# Patient Record
Sex: Male | Born: 1955 | Race: White | Hispanic: No | Marital: Married
Health system: Southern US, Community
[De-identification: ages and names within clinical notes are randomized; demographics above are authoritative.]

## PROBLEM LIST (undated history)

## (undated) DIAGNOSIS — Z87442 Personal history of urinary calculi: Secondary | ICD-10-CM

## (undated) DIAGNOSIS — C220 Liver cell carcinoma: Secondary | ICD-10-CM

## (undated) DIAGNOSIS — I10 Essential (primary) hypertension: Secondary | ICD-10-CM

## (undated) DIAGNOSIS — M549 Dorsalgia, unspecified: Secondary | ICD-10-CM

## (undated) DIAGNOSIS — D649 Anemia, unspecified: Secondary | ICD-10-CM

## (undated) DIAGNOSIS — K759 Inflammatory liver disease, unspecified: Secondary | ICD-10-CM

## (undated) HISTORY — PX: LIVER TRANSPLANT: SHX410

---

## 2017-01-02 DIAGNOSIS — A419 Sepsis, unspecified organism: Secondary | ICD-10-CM

## 2017-01-02 HISTORY — DX: Sepsis, unspecified organism: A41.9

## 2019-07-08 ENCOUNTER — Other Ambulatory Visit: Payer: Self-pay | Admitting: Infectious Diseases

## 2019-07-08 DIAGNOSIS — B182 Chronic viral hepatitis C: Secondary | ICD-10-CM

## 2019-07-08 DIAGNOSIS — D696 Thrombocytopenia, unspecified: Secondary | ICD-10-CM

## 2019-07-11 ENCOUNTER — Other Ambulatory Visit: Payer: Self-pay

## 2019-07-11 ENCOUNTER — Ambulatory Visit
Admission: RE | Admit: 2019-07-11 | Discharge: 2019-07-11 | Disposition: A | Discharge status: Home / Self Care | Payer: Medicare Other | Source: Ambulatory Visit | Attending: Infectious Diseases | Admitting: Infectious Diseases

## 2019-07-11 DIAGNOSIS — D696 Thrombocytopenia, unspecified: Secondary | ICD-10-CM | POA: Insufficient documentation

## 2019-07-11 DIAGNOSIS — B182 Chronic viral hepatitis C: Secondary | ICD-10-CM | POA: Diagnosis present

## 2019-07-11 IMAGING — US US ABDOMEN LIMITED
1 series · 14 of 25 positions shown · non-contrast
Comparison: None.

CLINICAL DATA: Hep C chronic

EXAM:
ULTRASOUND ABDOMEN LIMITED RIGHT UPPER QUADRANT

[Series 1: us abdomen limited ruq · 14 of 58 slices shown]
[im 1/58]
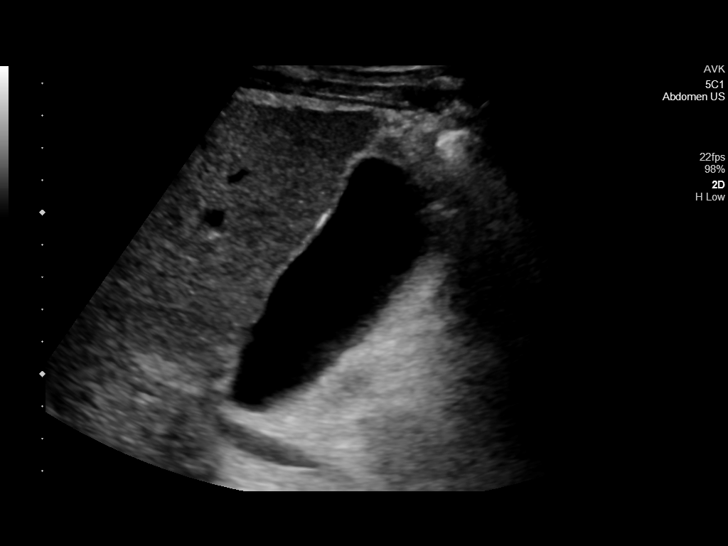
[im 5/58]
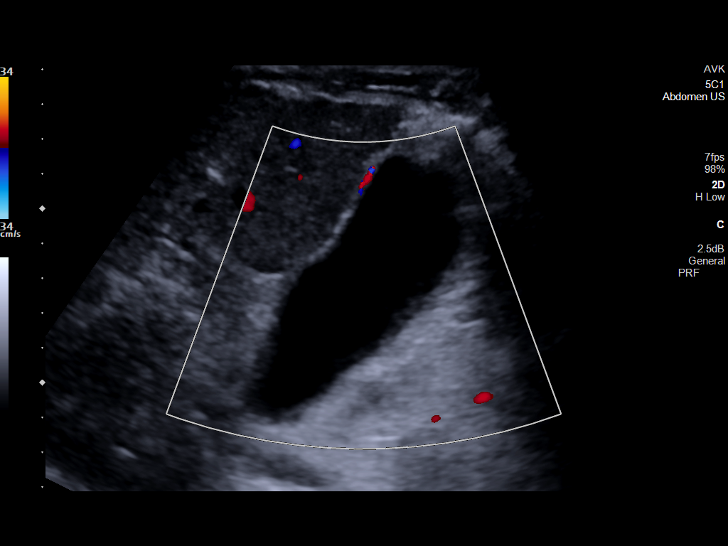
[im 10/58]
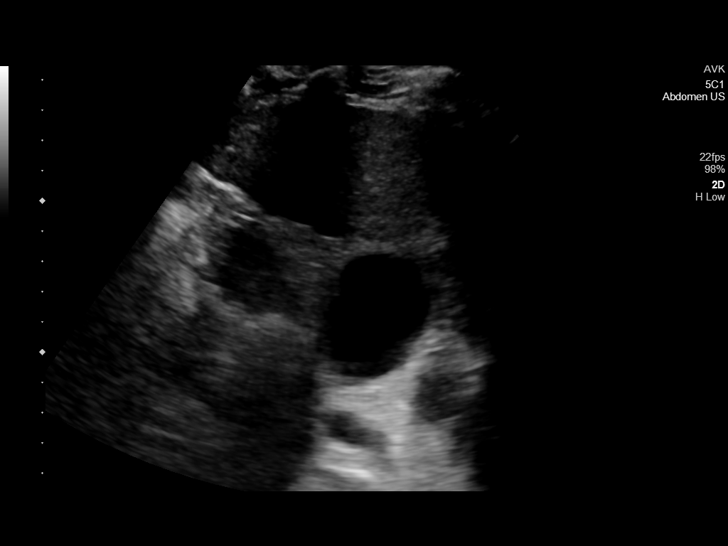
[im 15/58]
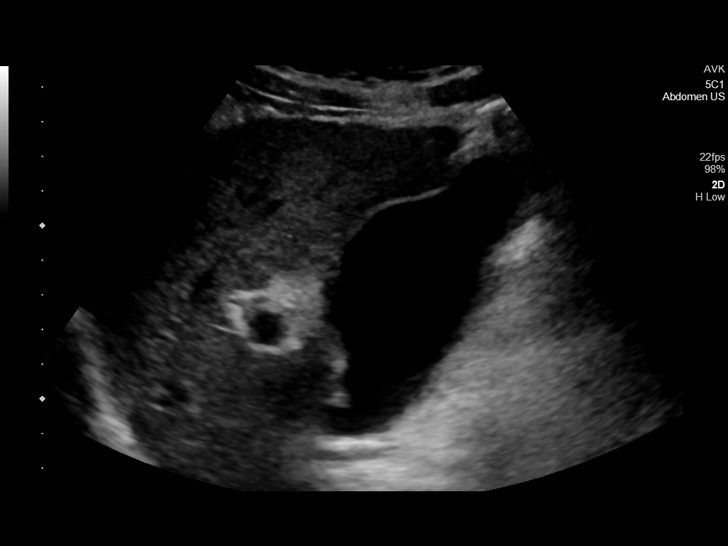
[im 20/58]
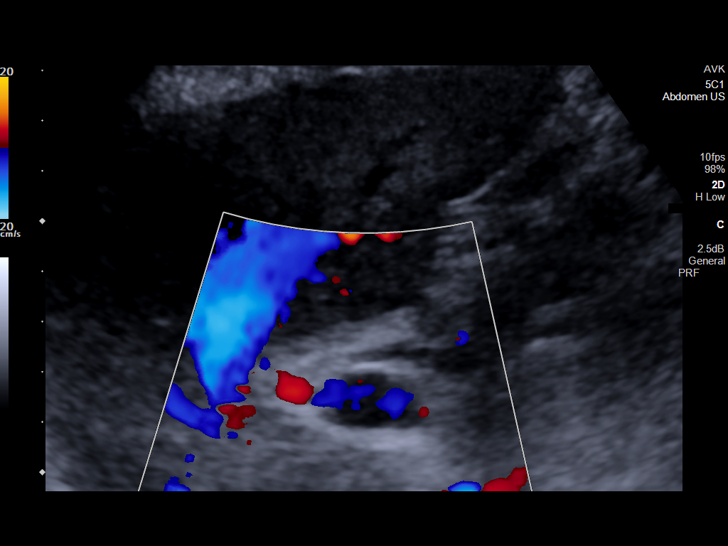
[im 22/58]
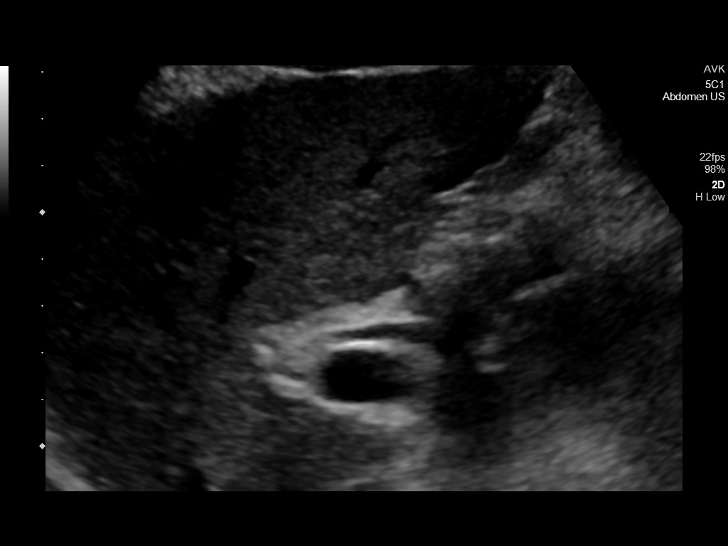
[im 27/58]
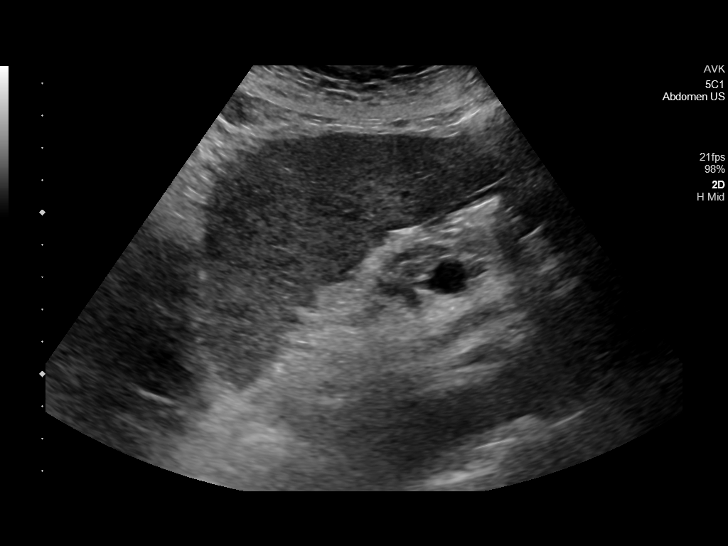
[im 31/58]
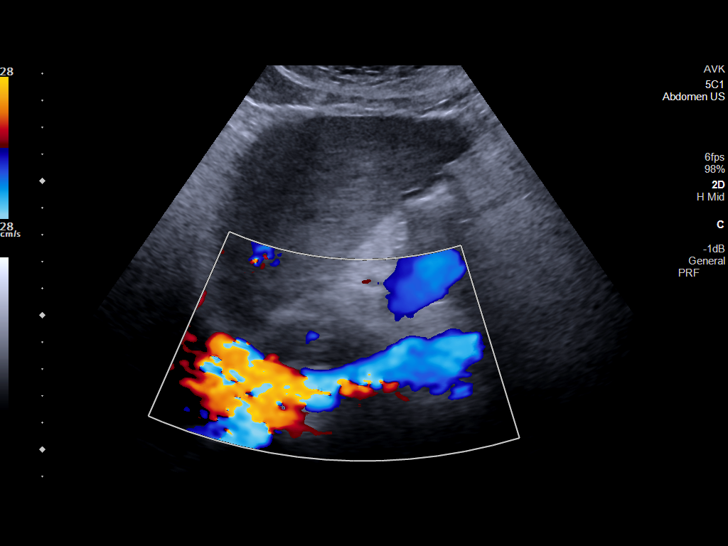
[im 36/58]
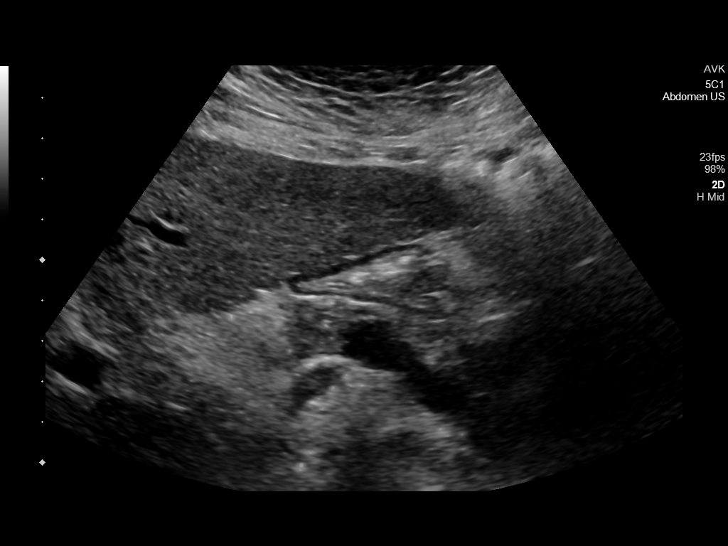
[im 39/58]
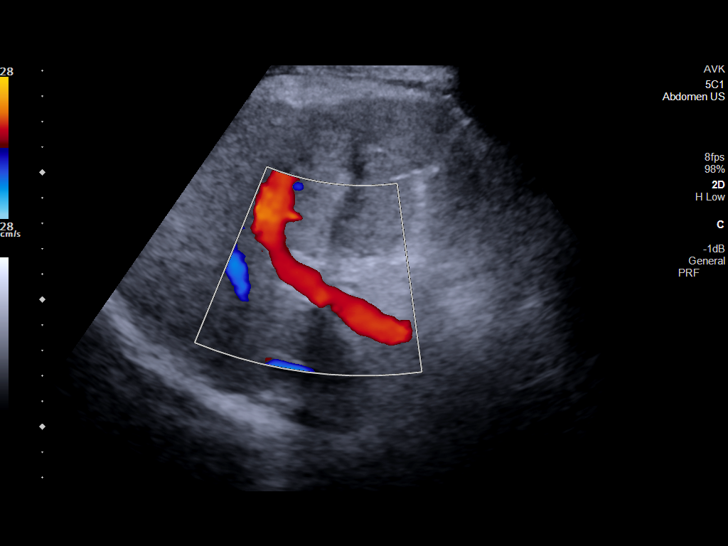
[im 43/58]
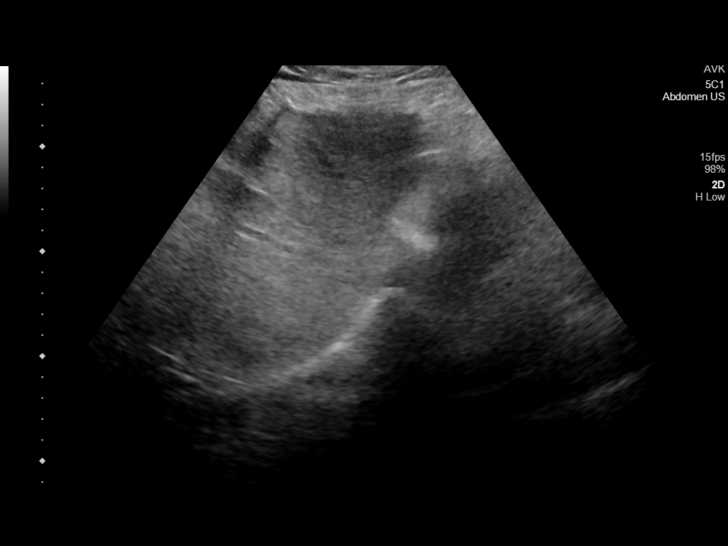
[im 48/58]
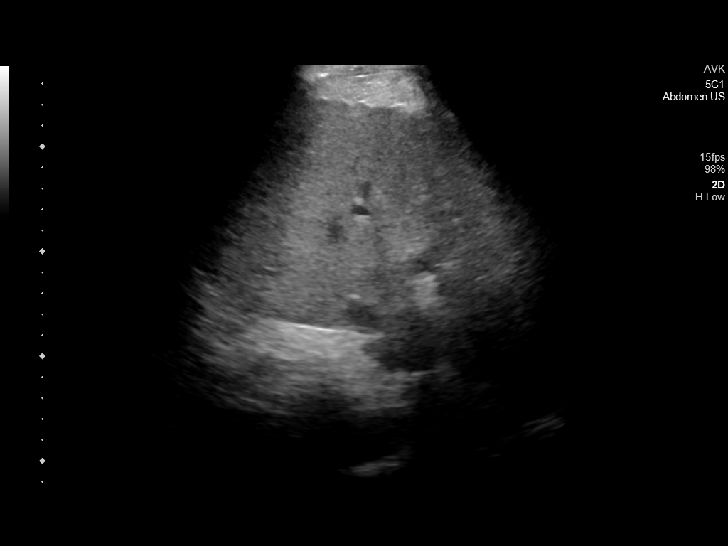
[im 53/58]
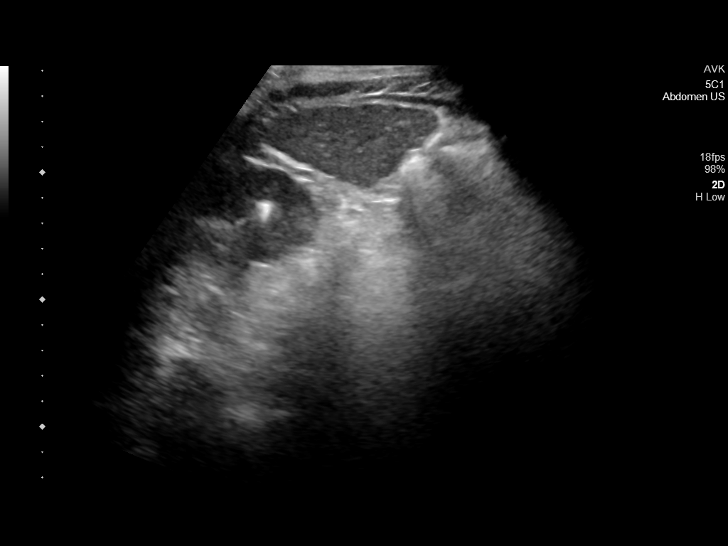
[im 58/58]
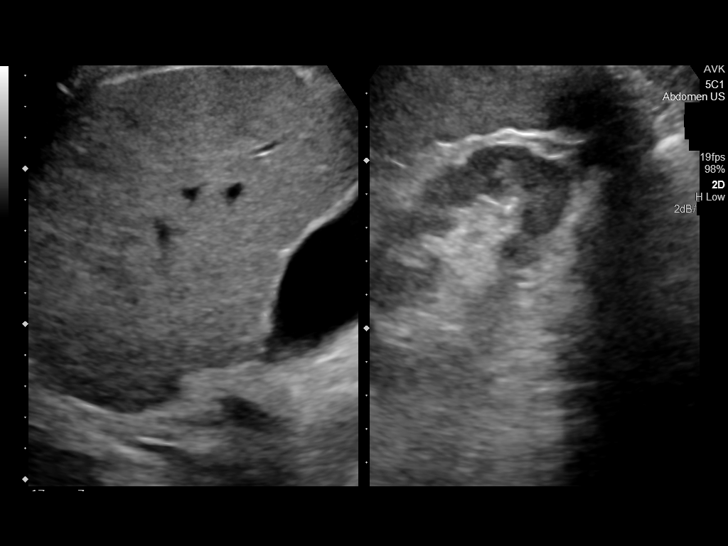

[14 of 25 positions shown; findings below may reference images not displayed]

FINDINGS: Gallbladder:

No gallstones or wall thickening visualized. No sonographic Murphy
sign noted by sonographer.

Common bile duct:

Diameter: 3.9 mm

Liver:

Liver is echogenic. Mild surface nodularity suspicious for
cirrhosis. No focal hepatic abnormality portal vein is patent on
color Doppler imaging with normal direction of blood flow towards
the liver.

Other: None.
IMPRESSION: Echogenic liver with nodular contour suspect for cirrhosis. No focal
hepatic mass lesion by sonography.

## 2019-09-10 ENCOUNTER — Other Ambulatory Visit: Payer: Self-pay | Admitting: Student

## 2019-09-10 DIAGNOSIS — B182 Chronic viral hepatitis C: Secondary | ICD-10-CM

## 2019-09-30 ENCOUNTER — Ambulatory Visit: Payer: Medicare Other

## 2019-10-07 ENCOUNTER — Ambulatory Visit
Admission: RE | Admit: 2019-10-07 | Discharge: 2019-10-07 | Disposition: A | Discharge status: Home / Self Care | Payer: Medicare Other | Source: Ambulatory Visit | Attending: Student | Admitting: Student

## 2019-10-07 ENCOUNTER — Other Ambulatory Visit: Payer: Self-pay

## 2019-10-07 DIAGNOSIS — B182 Chronic viral hepatitis C: Secondary | ICD-10-CM | POA: Diagnosis present

## 2019-10-07 DIAGNOSIS — K7460 Unspecified cirrhosis of liver: Secondary | ICD-10-CM | POA: Insufficient documentation

## 2019-10-07 IMAGING — MR MR ABDOMEN WO/W CM
16 of 17 series · 44 of 48 positions shown · IV contrast (8ml Gadavist)
Comparison: [DATE] abdominal ultrasound.

CLINICAL DATA: Chronic hepatitis C. Abnormal abdominal ultrasound
suspicious for cirrhosis.

EXAM:
MRI ABDOMEN WITHOUT AND WITH CONTRAST
TECHNIQUE: Multiplanar multisequence MR imaging of the abdomen was performed
both before and after the administration of intravenous contrast.
CONTRAST:  8mL GADAVIST GADOBUTROL 1 MMOL/ML IV SOLN

[Series 4: T2 · coronal · 6.0mm · 1.19mm/px · 2 of 34 slices shown (1 of 2)]
[im 1/34]
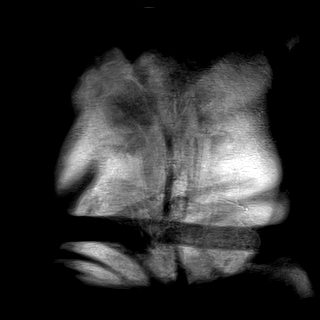
[im 34/34]
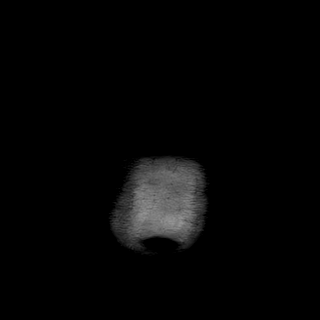

[Series 5: T2 · axial · 6.0mm · 1.19mm/px · z∈[-113,+125]mm · 2 of 34 slices shown (2 of 2)]
[im 1/34]
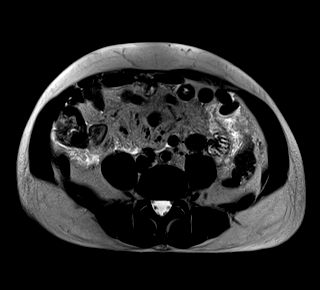
[im 34/34]
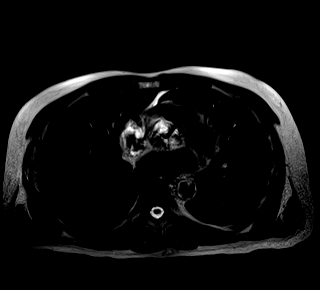

[Series 8: ax dwi_tracew · axial · 6.0mm · 1.42mm/px · z∈[-113,+125]mm · 5 of 102 slices shown]
[im 1/102]
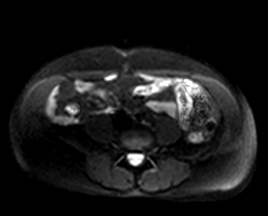
[im 26/102]
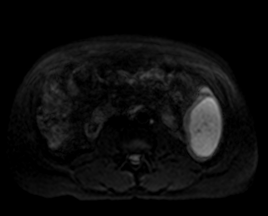
[im 51/102]
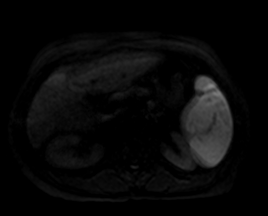
[im 76/102]
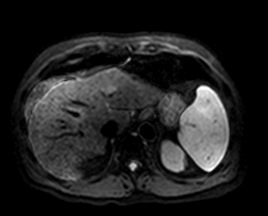
[im 102/102]
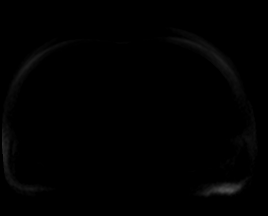

[Series 9: ax dwi_adc · axial · 6.0mm · 1.42mm/px · z∈[-113,+125]mm · 2 of 34 slices shown]
[im 1/34]
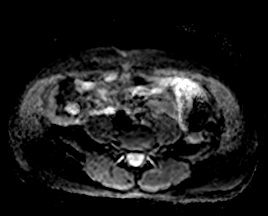
[im 34/34]
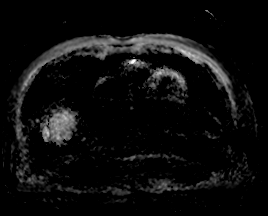

[Series 10: T1 · axial · 6.0mm · 0.74mm/px · z∈[-113,+125]mm · 4 of 68 slices shown]
[im 1/68]
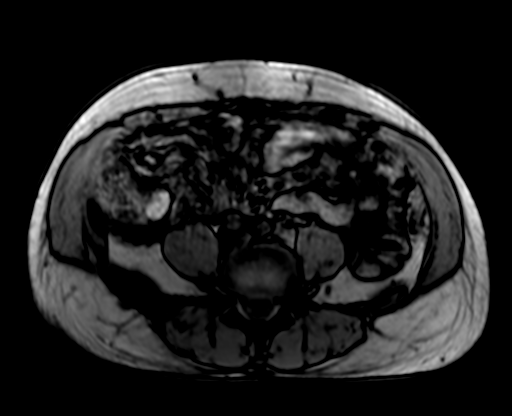
[im 23/68]
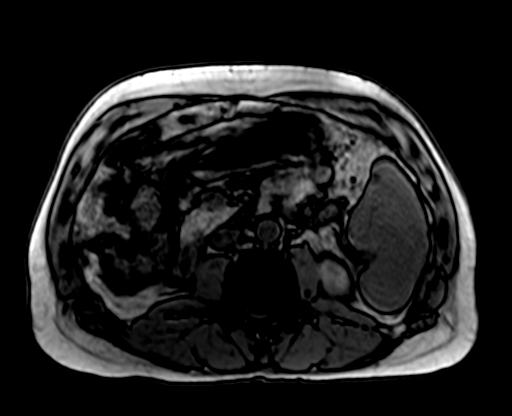
[im 45/68]
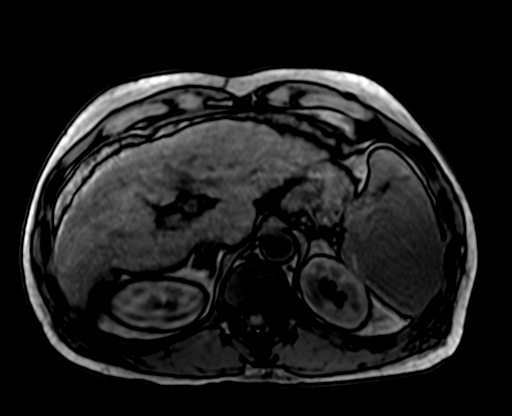
[im 68/68]
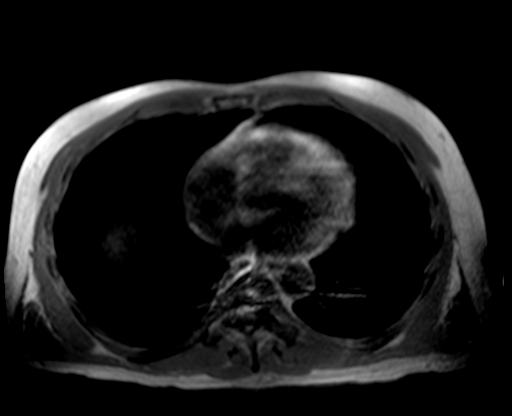

[Series 11: bSSFP · axial · 6.0mm · 0.74mm/px · z∈[-113,+125]mm · 2 of 34 slices shown]
[im 1/34]
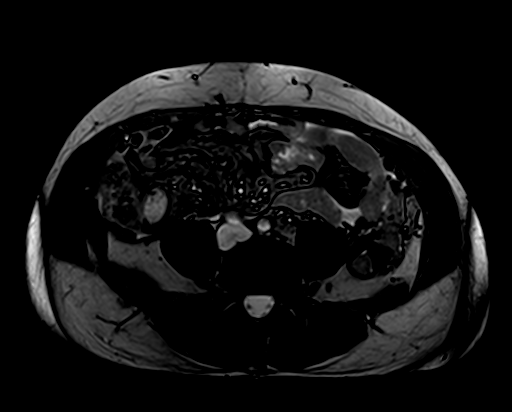
[im 34/34]
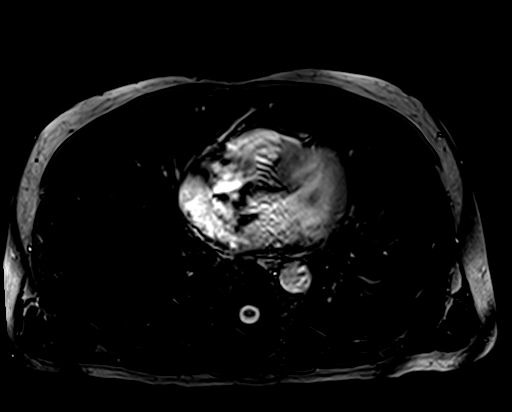

[Series 13: T2 fat-sat · axial · 6.0mm · 1.19mm/px · 1 of 34 slices shown]
[im 1/34]
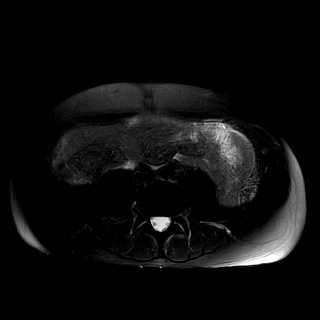

[Series 14: T1 dynamic fat-sat · axial · non-contrast · 3.0mm · 1.19mm/px · z∈[-112,+125]mm · 3 of 80 slices shown (1 of 4)]
[im 1/80]
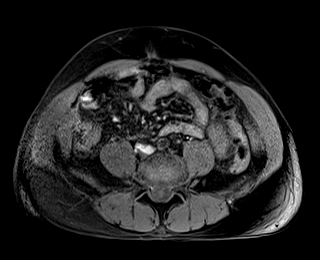
[im 40/80]
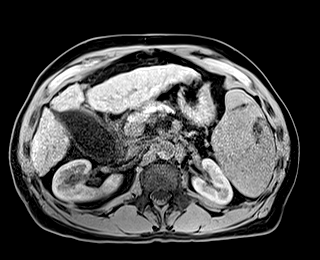
[im 80/80]
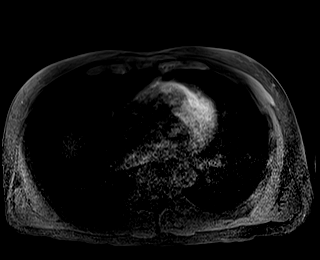

[Series 15: T1 dynamic fat-sat post-contrast · axial · 3.0mm · 1.19mm/px · z∈[-112,+125]mm · 3 of 80 slices shown (1 of 4)]
[im 1/80]
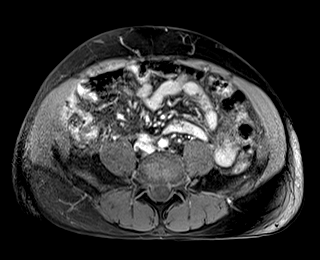
[im 40/80]
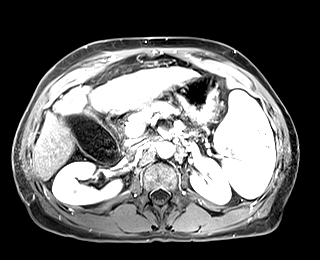
[im 80/80]
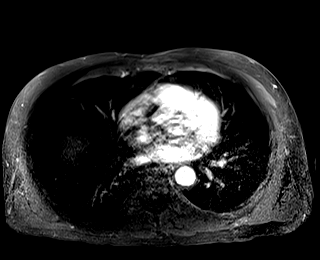

[Series 16: T1 dynamic fat-sat · axial · 3.0mm · 1.19mm/px · z∈[-112,+125]mm · 3 of 80 slices shown (2 of 4)]
[im 1/80]
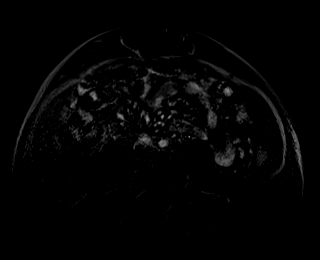
[im 40/80]
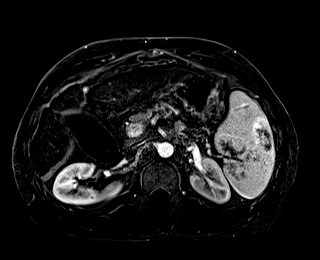
[im 80/80]
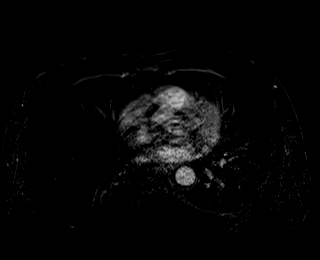

[Series 17: T1 dynamic fat-sat post-contrast · axial · 3.0mm · 1.19mm/px · z∈[-112,+125]mm · 3 of 80 slices shown (2 of 4)]
[im 1/80]
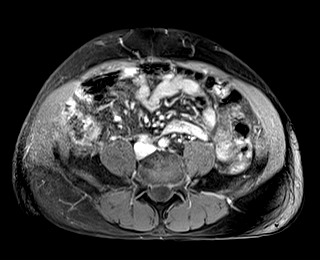
[im 40/80]
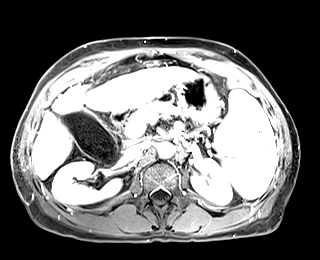
[im 80/80]
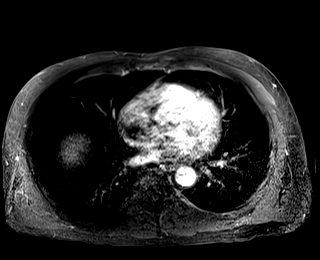

[Series 18: T1 dynamic fat-sat · axial · 3.0mm · 1.19mm/px · z∈[-112,+125]mm · 3 of 80 slices shown (3 of 4)]
[im 1/80]
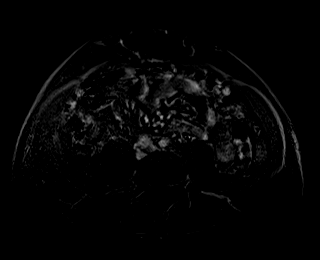
[im 40/80]
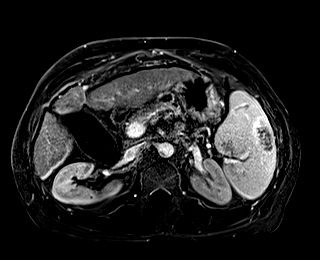
[im 80/80]
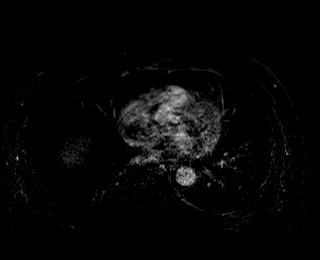

[Series 19: T1 dynamic fat-sat post-contrast · axial · 3.0mm · 1.19mm/px · z∈[-112,+125]mm · 3 of 80 slices shown (3 of 4)]
[im 1/80]
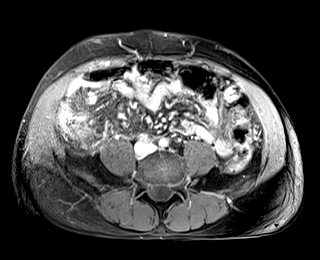
[im 40/80]
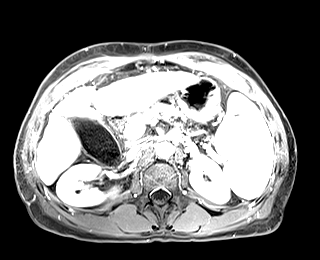
[im 80/80]
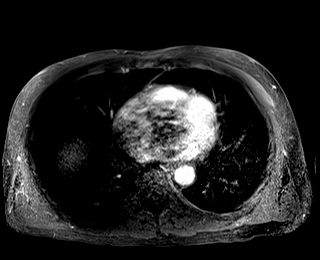

[Series 20: T1 dynamic fat-sat · axial · 3.0mm · 1.19mm/px · z∈[-112,+125]mm · 3 of 80 slices shown (4 of 4)]
[im 1/80]
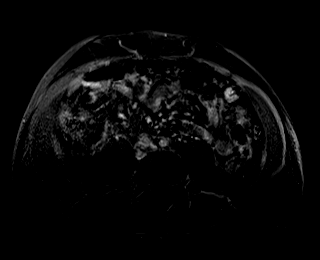
[im 40/80]
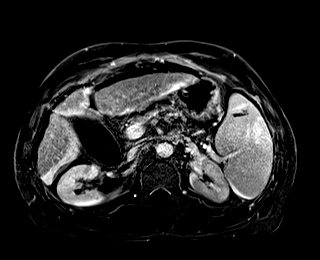
[im 80/80]
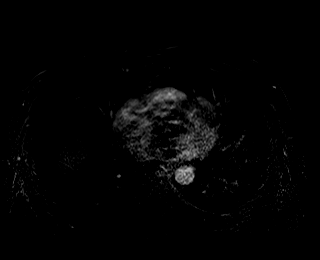

[Series 21: T1 dynamic post-contrast · coronal · 3.0mm · 1.31mm/px · 3 of 80 slices shown]
[im 1/80]
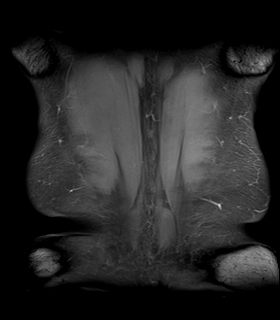
[im 40/80]
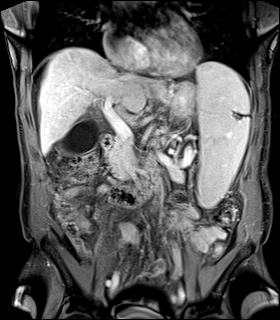
[im 80/80]
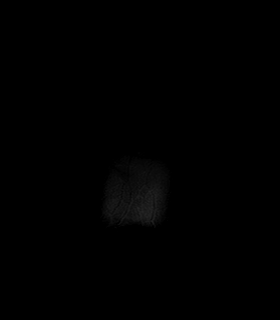

[Series 22: T1 dynamic fat-sat post-contrast · axial · 3.0mm · 1.19mm/px · z∈[-112,+5]mm · 2 of 80 slices shown (4 of 4)]
[im 1/80]
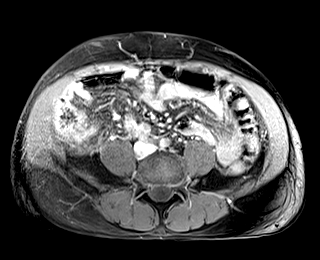
[im 40/80]
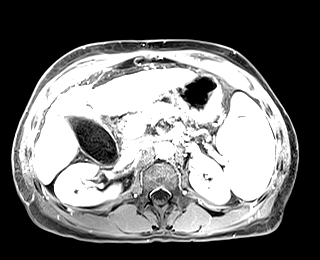

[44 of 48 positions shown; findings below may reference images not displayed]

FINDINGS: Lower chest: Normal heart size without pericardial or pleural
effusion. Suspect small periesophageal hip varices.

Hepatobiliary: Moderate cirrhosis, as evidenced by irregular hepatic
capsule, caudate lobe enlargement and medial segment left liver lobe
atrophy.

Multiple hepatic observations of varying suspicion.

-1. Lateral hepatic dome 7 mm focus of arterial hyperenhancement on
[DATE], without a correlate on other pulse sequences. LR 3

-2. More medial hepatic dome (likely segment 8) 1.0 cm focus of
arterial hyperenhancement on [DATE] corresponds to portal venous
phase hypoenhancement including on [DATE]. LR 5

-3. Segment 8 1.0 cm focus of arterial hyperenhancement on 18/15
corresponds to portal venous phase hypoenhancement on 18/19. LR 5

-4. Segment 2 1.6 cm focus of arterial hyperenhancement on 23/15 is
without correlate on other pulse sequences. LR 3

-5. Inferior right hepatic lobe 1.0 cm arterial focus of
hyperenhancement on 51/15 is without correlate on other pulse
sequences. LR 3

-6. Portal venous phase hypoenhancement at the base of the caudate
lobe measures 9 mm on 25/19 and is without correlate on arterial
phase imaging. LR 3

Mild gallbladder distension and wall thickening, nonspecific in the
setting of cirrhosis. No stones or pericholecystic hyperemia.

Pancreas:  Normal, without mass or ductal dilatation.

Spleen: Splenomegaly, including at 18.2 cm craniocaudal. A lateral
splenic hypoenhancing lesion measures 4.0 x 2.1 cm on 33/20. This is
mildly T1-T2 hypointense prior to contrast. This is relatively
linear and wedge-shaped on coronal series 21, favoring infarct.
Other areas of linear hypoenhancement within the more anterior
spleen may also represent remote infarcts.

Adrenals/Urinary Tract: Normal adrenal glands. Normal kidneys,
without hydronephrosis.

Stomach/Bowel: Gastric cardia varices. Normal abdominal bowel loops.

Vascular/Lymphatic: Aortic atherosclerosis. Patent portal, hepatic,
splenic veins. Portal venous hypertension, including a recannulized
paraumbilical vein.

Other:  Small volume abdominal ascites.

Musculoskeletal: No acute osseous abnormality.
IMPRESSION: 1. Multiple liver lesions as detailed above. 2 lesions, within the
hepatic dome, are consistent with hepatocellular carcinoma (LR 5).
2. Cirrhosis with portal venous hypertension, splenomegaly, and
ascites.
3. Nonspecific gallbladder wall thickening and distension in the
setting of portal venous hypertension.
4. Splenic infarcts.

These results will be called to the ordering clinician or
representative by the Radiologist Assistant, and communication
documented in the PACS or [REDACTED].

## 2019-10-07 MED ORDER — GADOBUTROL 1 MMOL/ML IV SOLN
8.0000 mL | Freq: Once | INTRAVENOUS | Status: AC | PRN
  Administered 2019-10-07: 8 mL via INTRAVENOUS

## 2019-10-13 ENCOUNTER — Ambulatory Visit (INDEPENDENT_AMBULATORY_CARE_PROVIDER_SITE_OTHER): Payer: Medicare Other

## 2019-10-13 ENCOUNTER — Ambulatory Visit (INDEPENDENT_AMBULATORY_CARE_PROVIDER_SITE_OTHER): Payer: Medicare Other | Admitting: Dermatology

## 2019-10-13 ENCOUNTER — Other Ambulatory Visit: Payer: Self-pay

## 2019-10-13 DIAGNOSIS — C229 Malignant neoplasm of liver, not specified as primary or secondary: Secondary | ICD-10-CM

## 2019-10-13 DIAGNOSIS — L409 Psoriasis, unspecified: Secondary | ICD-10-CM

## 2019-10-13 DIAGNOSIS — L400 Psoriasis vulgaris: Secondary | ICD-10-CM | POA: Diagnosis not present

## 2019-10-13 MED ORDER — MOMETASONE FUROATE 0.1 % EX SOLN: Freq: Every day | CUTANEOUS | 2 refills | Status: DC

## 2019-10-13 MED ORDER — TRIAMCINOLONE ACETONIDE 0.1 % EX OINT: TOPICAL_OINTMENT | CUTANEOUS | 2 refills | Status: DC

## 2019-10-13 NOTE — Progress Notes (Signed)
Patient treated in the lightbox/NBUVB for 1 minutes and 44 seconds.

## 2019-10-13 NOTE — Progress Notes (Signed)
   New Patient Visit  Subjective  Jaime Powell is a 64 y.o. male who presents for the following: Psoriasis (Patient here today for psoriasis all over. He has been using clobetasol 0.05% ointment. ). It does itch, clobetasol 0.05% not helping.  Patient was in [facility] March and April 2021 for 1 1/2 months.  Patient diagnosed last week with liver cancer.   The following portions of the chart were reviewed this encounter and updated as appropriate:  Allergies  Meds  Problems  Med Hx  Surg Hx  Fam Hx     Review of Systems:  No other skin or systemic complaints except as noted in HPI or Assessment and Plan.  Objective  Well appearing patient in no apparent distress; mood and affect are within normal limits.  A focused examination was performed including face, neck, chest and back and scalp, legs, arms. Relevant physical exam findings are noted in the Assessment and Plan.  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 Trunk, extremities, scalp Will plan to start NVUVB treatments twice weekly. 1st treatment today.  Start TMC 0.1% ointment to affected areas one to two times daily as needed for psoriasis. Avoid applying to face, groin, and axilla. Use as directed. Risk of skin atrophy with long-term use reviewed.   Start mometasone solution every other day to scalp and leave in overnight. Avoid applying to face, groin, and axilla. Use as directed. Risk of skin atrophy with long-term use reviewed.   Recommend Tar shampoo.  Topical steroids (such as triamcinolone, fluocinolone, fluocinonide, mometasone, clobetasol, halobetasol, betamethasone, hydrocortisone) can cause thinning and lightening of the skin if they are used  for too long in the same area. Your physician has selected the right strength medicine for your problem and area affected on the body. Please use your medication only as directed by your physician to prevent side effects.   Ordered Medications: mometasone (ELOCON) 0.1 % lotion triamcinolone ointment (KENALOG) 0.1 %  Return in about 6 weeks (around 11/24/2019).  Graciella Belton, RMA, am acting as scribe for Sarina Ser, MD . Documentation: I have reviewed the above documentation for accuracy and completeness, and I agree with the above.  Sarina Ser, MD

## 2019-10-13 NOTE — Patient Instructions (Signed)
Topical steroids (such as triamcinolone, fluocinolone, fluocinonide, mometasone, clobetasol, halobetasol, betamethasone, hydrocortisone) can cause thinning and lightening of the skin if they are used for too long in the same area. Your physician has selected the right strength medicine for your problem and area affected on the body. Please use your medication only as directed by your physician to prevent side effects.   . 

## 2019-10-14 ENCOUNTER — Encounter: Payer: Self-pay | Admitting: Dermatology

## 2019-10-15 ENCOUNTER — Ambulatory Visit: Payer: Medicare Other

## 2019-10-29 ENCOUNTER — Ambulatory Visit: Payer: Medicare Other | Admitting: Dermatology

## 2019-12-17 ENCOUNTER — Other Ambulatory Visit: Payer: Self-pay | Admitting: Internal Medicine

## 2019-12-17 DIAGNOSIS — B1920 Unspecified viral hepatitis C without hepatic coma: Secondary | ICD-10-CM

## 2019-12-17 DIAGNOSIS — R1011 Right upper quadrant pain: Secondary | ICD-10-CM

## 2019-12-19 ENCOUNTER — Other Ambulatory Visit: Payer: Self-pay

## 2019-12-19 ENCOUNTER — Ambulatory Visit
Admission: RE | Admit: 2019-12-19 | Discharge: 2019-12-19 | Disposition: A | Discharge status: Home / Self Care | Payer: Medicare Other | Source: Ambulatory Visit | Attending: Internal Medicine | Admitting: Internal Medicine

## 2019-12-19 DIAGNOSIS — B1920 Unspecified viral hepatitis C without hepatic coma: Secondary | ICD-10-CM | POA: Diagnosis present

## 2019-12-19 DIAGNOSIS — R1011 Right upper quadrant pain: Secondary | ICD-10-CM | POA: Diagnosis present

## 2019-12-19 IMAGING — US US ABDOMEN LIMITED
1 series · 15 of 25 positions shown · non-contrast
Comparison: Abdominal MRI [DATE] and ultrasound [DATE]

CLINICAL DATA: Right upper quadrant pain. Hepatitis C. History of
HCC status post recent radioembolization.

EXAM:
ULTRASOUND ABDOMEN LIMITED RIGHT UPPER QUADRANT

[Series 1: us abdomen limited ruq · 15 of 43 slices shown]
[im 1/43]
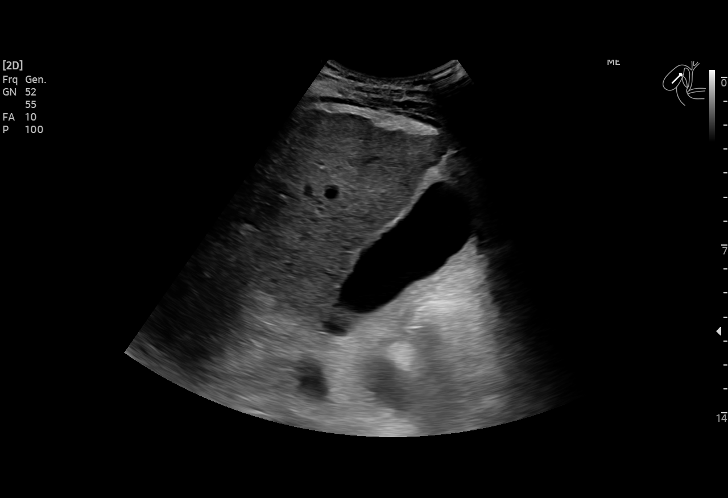
[im 4/43]
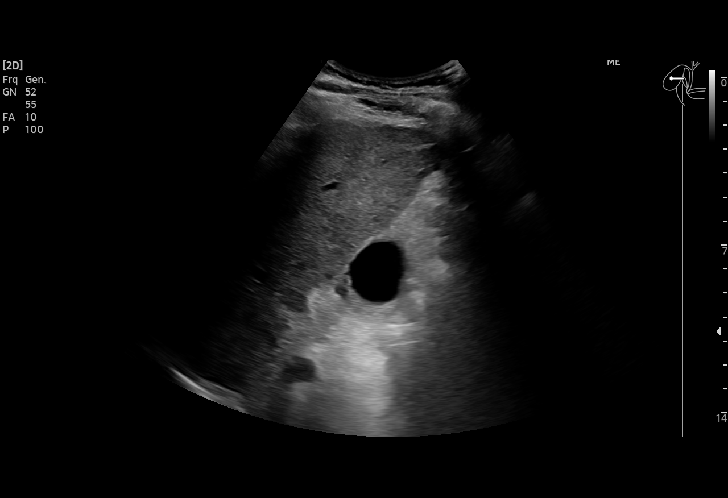
[im 8/43]
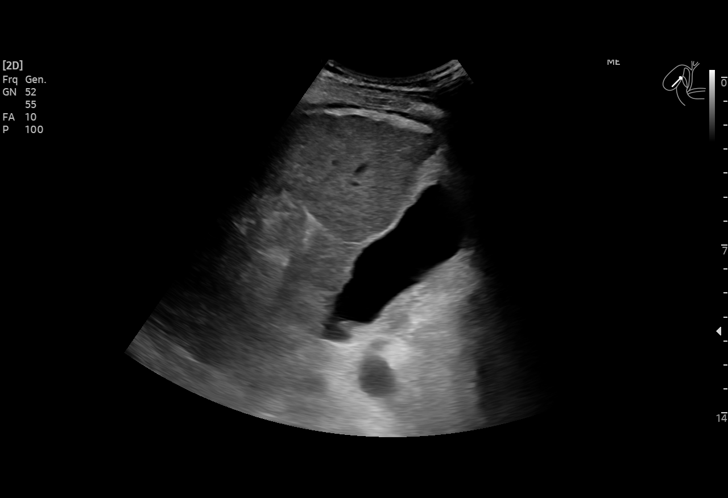
[im 9/43]
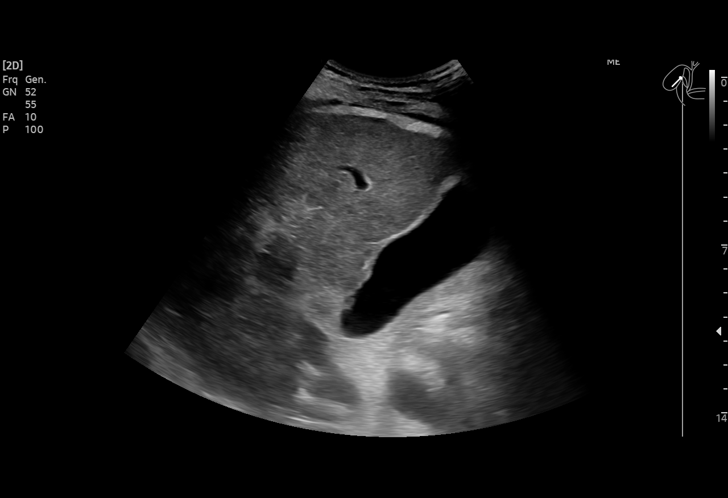
[im 13/43]
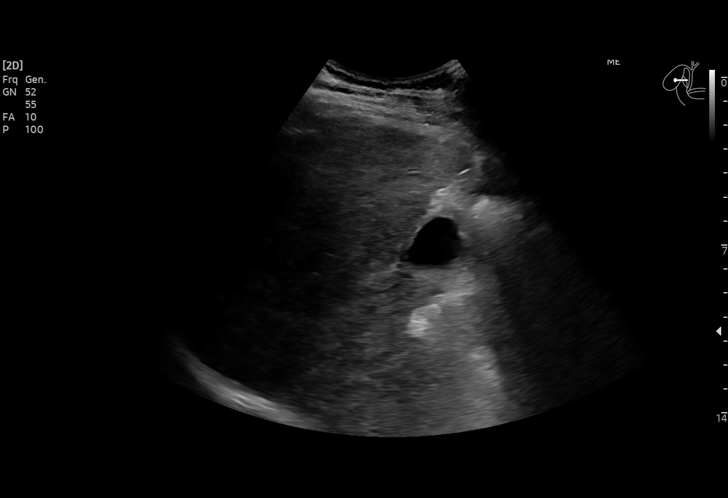
[im 16/43]
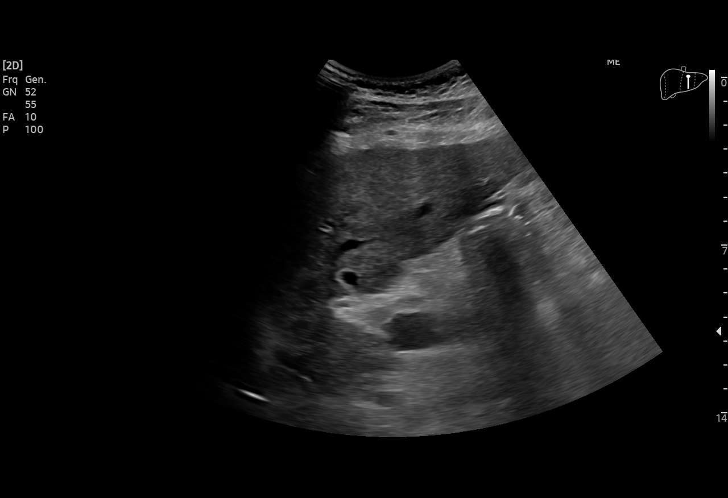
[im 18/43]
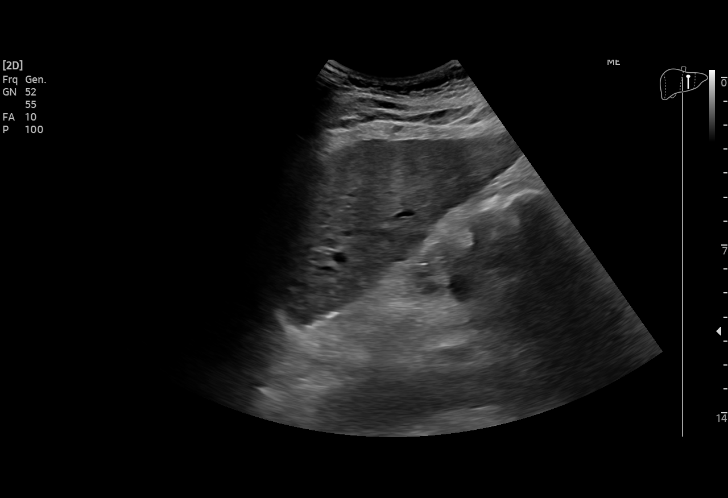
[im 22/43]
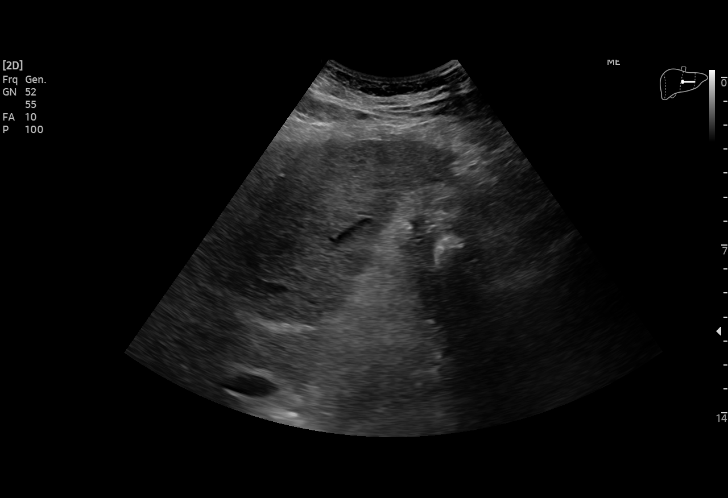
[im 25/43]
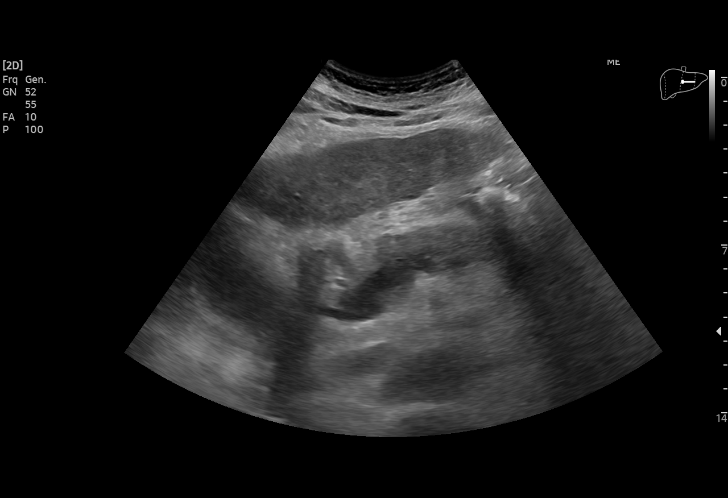
[im 27/43]
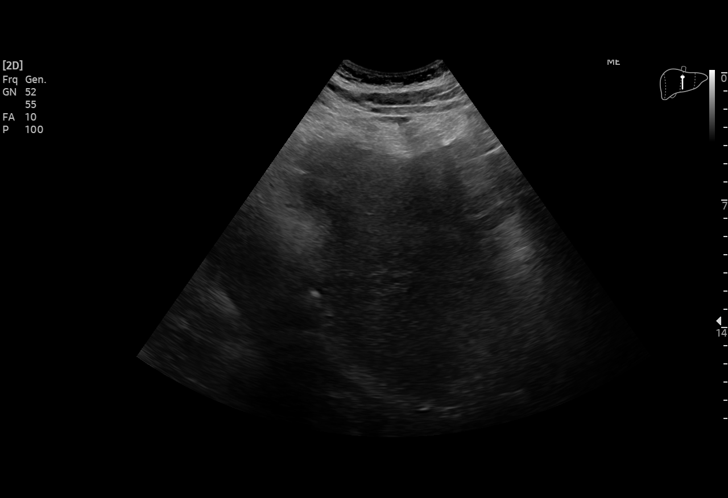
[im 30/43]
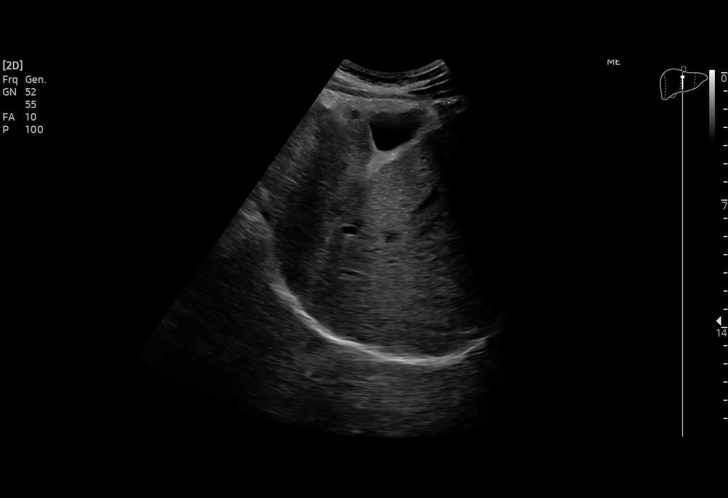
[im 34/43]
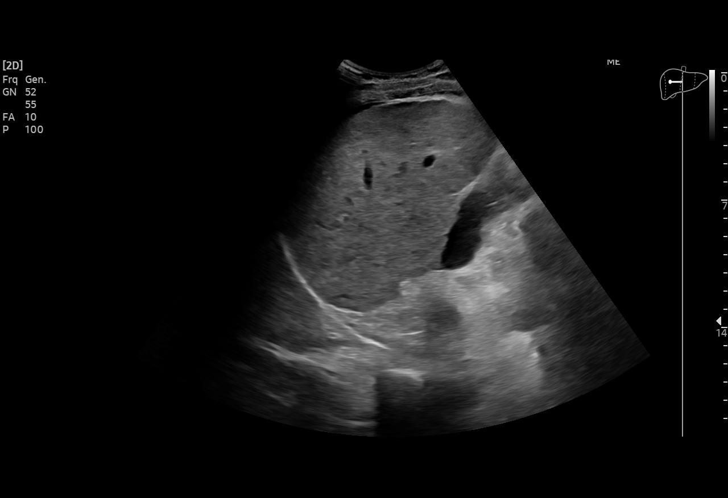
[im 36/43]
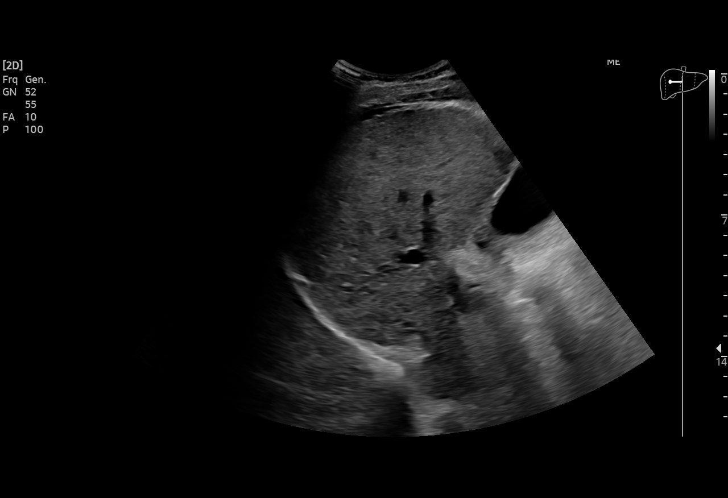
[im 39/43]
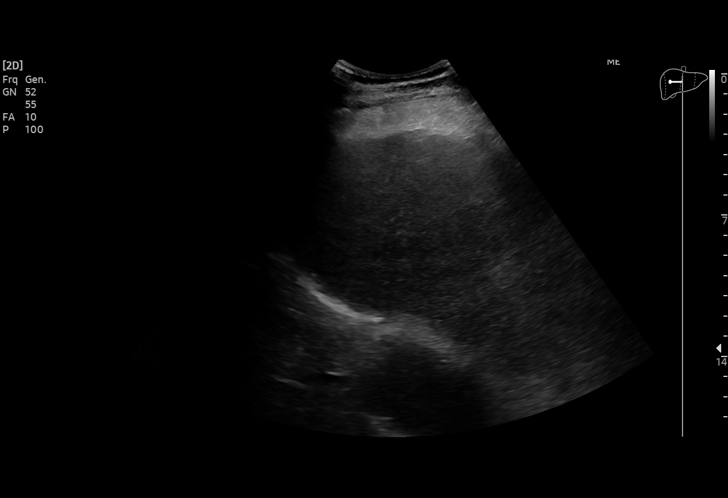
[im 43/43]
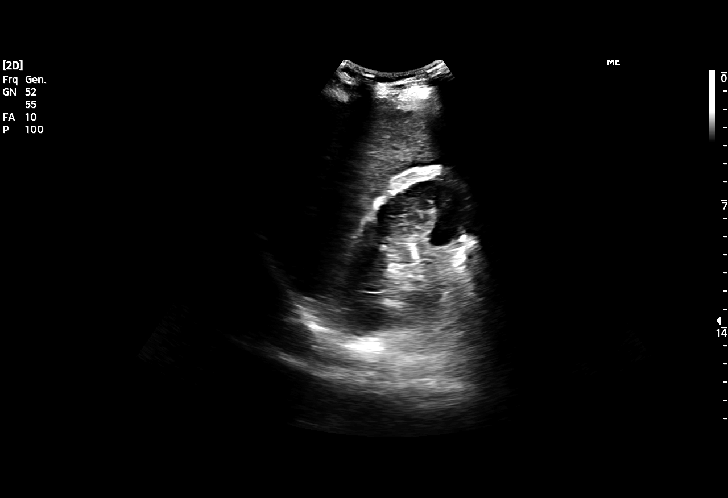

[15 of 25 positions shown; findings below may reference images not displayed]

FINDINGS: Gallbladder:

No gallstones. Mild gallbladder wall thickening, also present on the
prior MRI and likely secondary to chronic liver disease. No
sonographic Murphy sign noted by sonographer.

Common bile duct:

Diameter: 4 mm

Liver:

Diffusely increased parenchymal echogenicity with nodular liver
contour. Multiple small liver lesions on MRI are not clearly visible
by ultrasound. Portal vein is patent on color Doppler imaging with
normal direction of blood flow towards the liver.

Other: None.
IMPRESSION: 1. Cirrhosis.  No visible liver mass or acute finding on ultrasound.
2. Mild chronic gallbladder wall thickening.

## 2020-01-03 DIAGNOSIS — Z944 Liver transplant status: Secondary | ICD-10-CM

## 2020-01-03 HISTORY — DX: Liver transplant status: Z94.4

## 2021-04-07 ENCOUNTER — Other Ambulatory Visit: Payer: Self-pay | Admitting: Sports Medicine

## 2021-04-07 ENCOUNTER — Other Ambulatory Visit (HOSPITAL_COMMUNITY): Payer: Self-pay | Admitting: Sports Medicine

## 2021-04-07 DIAGNOSIS — M778 Other enthesopathies, not elsewhere classified: Secondary | ICD-10-CM

## 2021-04-07 DIAGNOSIS — M7542 Impingement syndrome of left shoulder: Secondary | ICD-10-CM

## 2021-04-07 DIAGNOSIS — M67922 Unspecified disorder of synovium and tendon, left upper arm: Secondary | ICD-10-CM

## 2021-04-07 DIAGNOSIS — M7552 Bursitis of left shoulder: Secondary | ICD-10-CM

## 2021-04-07 DIAGNOSIS — G8929 Other chronic pain: Secondary | ICD-10-CM

## 2021-04-07 DIAGNOSIS — M7522 Bicipital tendinitis, left shoulder: Secondary | ICD-10-CM

## 2021-04-22 ENCOUNTER — Ambulatory Visit
Admission: RE | Admit: 2021-04-22 | Discharge: 2021-04-22 | Disposition: A | Discharge status: Home / Self Care | Payer: Medicare Other | Source: Ambulatory Visit | Attending: Sports Medicine | Admitting: Sports Medicine

## 2021-04-22 ENCOUNTER — Ambulatory Visit: Admission: RE | Admit: 2021-04-22 | Payer: Medicare Other | Source: Ambulatory Visit

## 2021-04-22 DIAGNOSIS — M7552 Bursitis of left shoulder: Secondary | ICD-10-CM | POA: Insufficient documentation

## 2021-04-22 DIAGNOSIS — G8929 Other chronic pain: Secondary | ICD-10-CM | POA: Insufficient documentation

## 2021-04-22 DIAGNOSIS — M778 Other enthesopathies, not elsewhere classified: Secondary | ICD-10-CM | POA: Insufficient documentation

## 2021-04-22 DIAGNOSIS — M25512 Pain in left shoulder: Secondary | ICD-10-CM | POA: Insufficient documentation

## 2021-04-22 DIAGNOSIS — M67922 Unspecified disorder of synovium and tendon, left upper arm: Secondary | ICD-10-CM | POA: Insufficient documentation

## 2021-04-22 DIAGNOSIS — M7542 Impingement syndrome of left shoulder: Secondary | ICD-10-CM | POA: Insufficient documentation

## 2021-04-22 DIAGNOSIS — M7522 Bicipital tendinitis, left shoulder: Secondary | ICD-10-CM | POA: Insufficient documentation

## 2021-04-22 IMAGING — MR MR SHOULDER*L* W/O CM
4 of 5 series · 30 of 40 positions shown · non-contrast
Comparison: None available

CLINICAL DATA: Left shoulder pain, progressively worse for 6
months. Liver transplant 6 months ago.

EXAM:
MRI OF THE LEFT SHOULDER WITHOUT CONTRAST
TECHNIQUE: Multiplanar, multisequence MR imaging of the shoulder was performed.
No intravenous contrast was administered.

[Series 5: T2 fat-sat · axial · left · 4.0mm · 0.44mm/px · z∈[-82,+36]mm · 8 of 26 slices shown (1 of 3)]
[im 1/26]
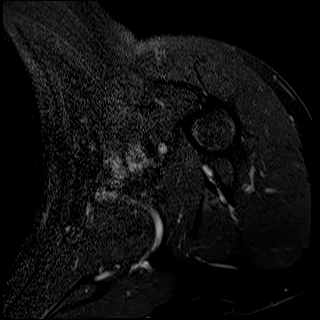
[im 4/26]
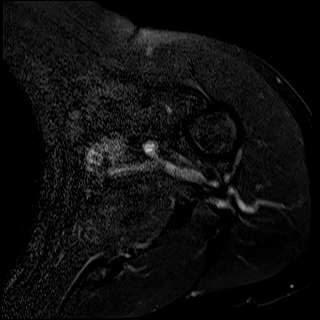
[im 8/26]
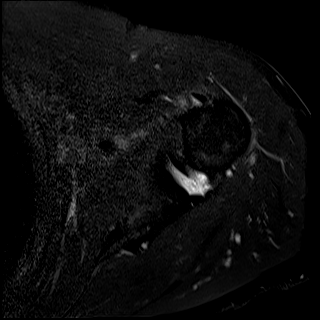
[im 11/26]
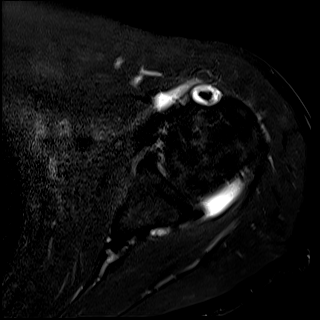
[im 15/26]
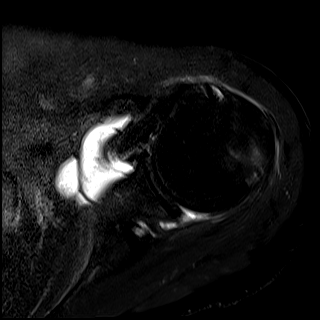
[im 18/26]
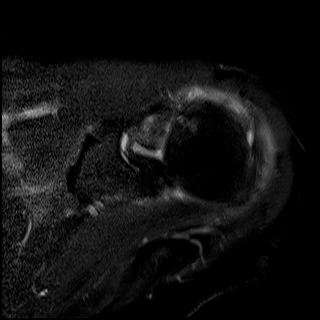
[im 22/26]
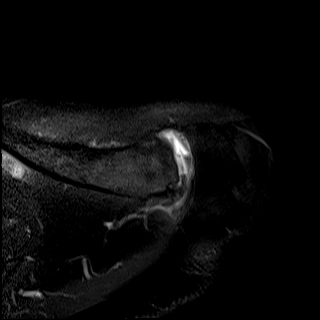
[im 26/26]
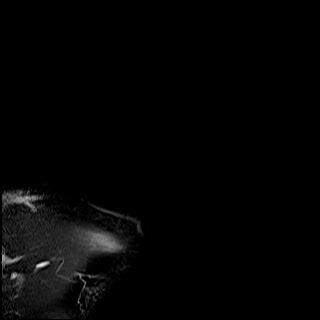

[Series 6: PD · oblique · left · 4.0mm · 0.44mm/px · 8 of 26 slices shown]
[im 1/26]
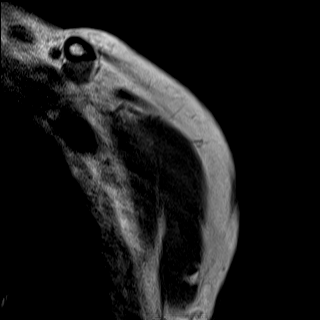
[im 4/26]
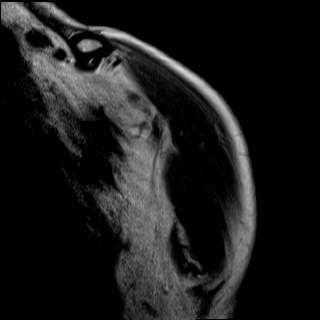
[im 8/26]
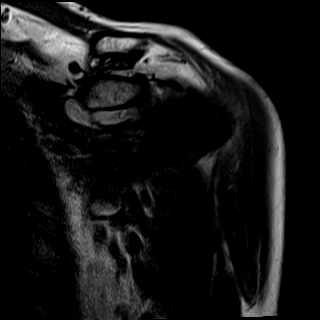
[im 11/26]
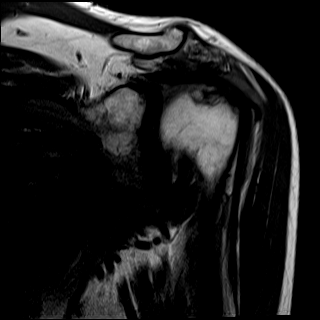
[im 15/26]
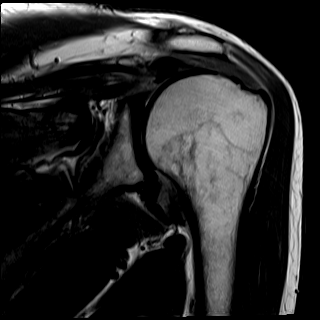
[im 18/26]
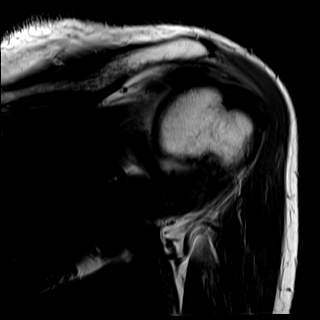
[im 22/26]
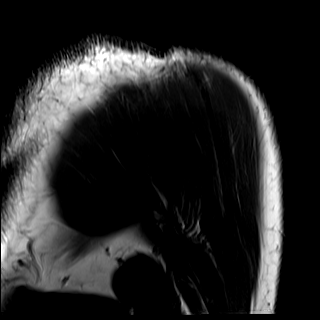
[im 26/26]
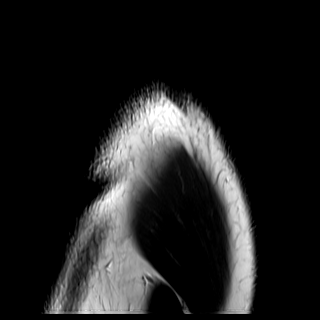

[Series 7: T2 fat-sat · oblique · left · 4.0mm · 0.44mm/px · 8 of 26 slices shown (2 of 3)]
[im 1/26]
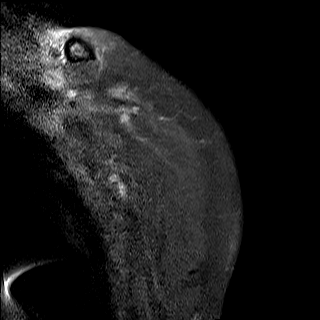
[im 4/26]
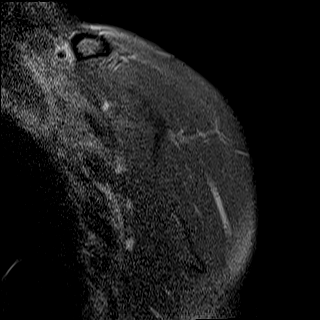
[im 8/26]
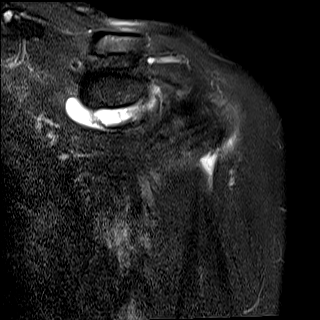
[im 11/26]
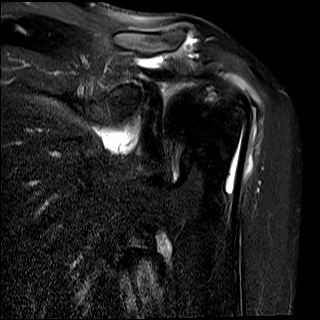
[im 15/26]
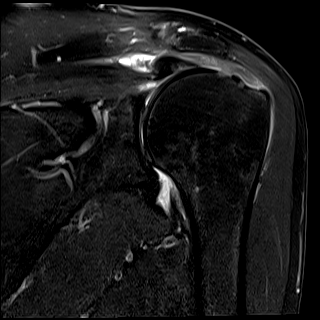
[im 18/26]
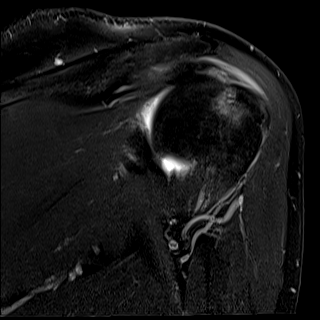
[im 22/26]
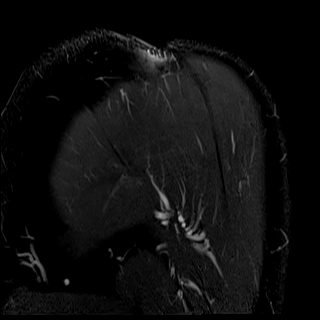
[im 26/26]
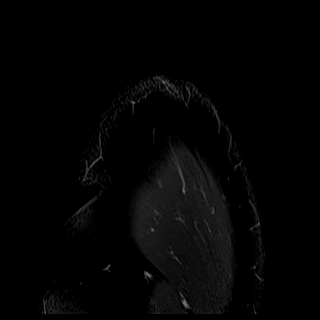

[Series 8: T2 fat-sat · sagittal · left · 4.0mm · 0.22mm/px · 6 of 24 slices shown (3 of 3)]
[im 1/24]
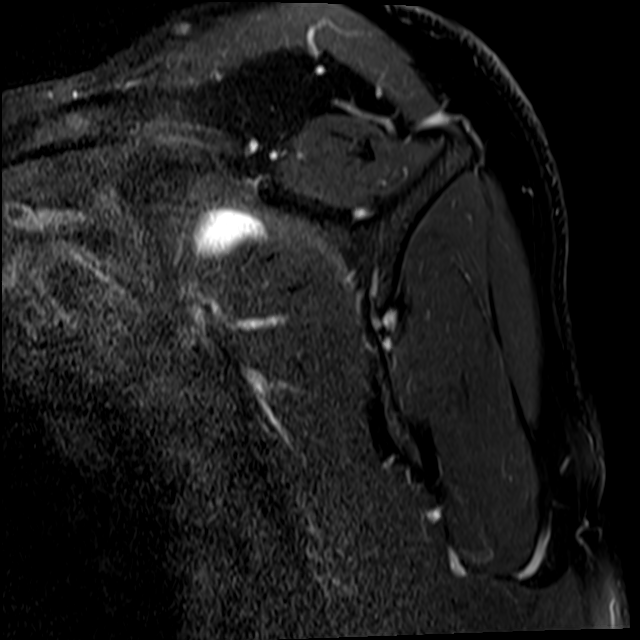
[im 4/24]
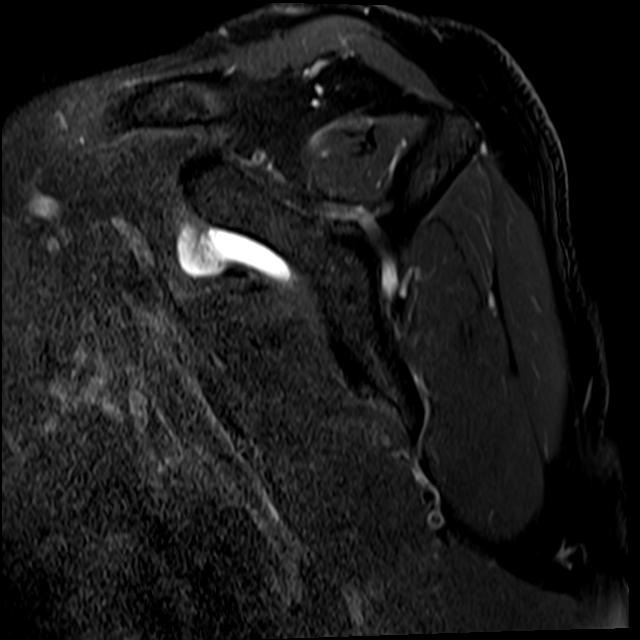
[im 7/24]
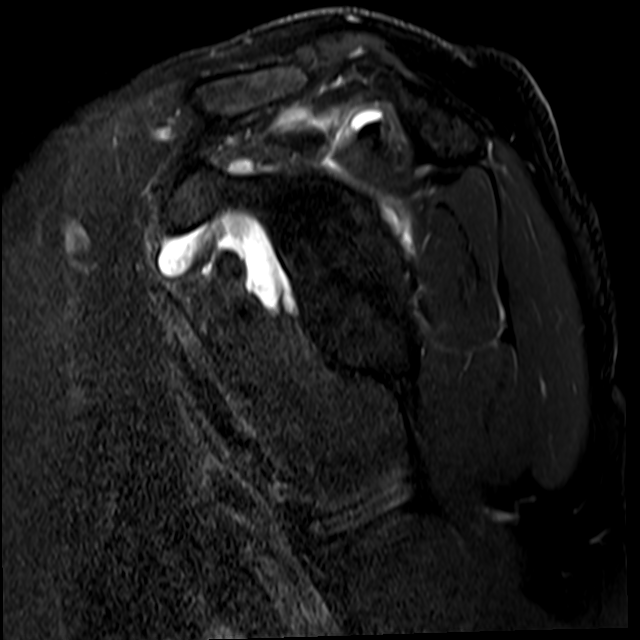
[im 10/24]
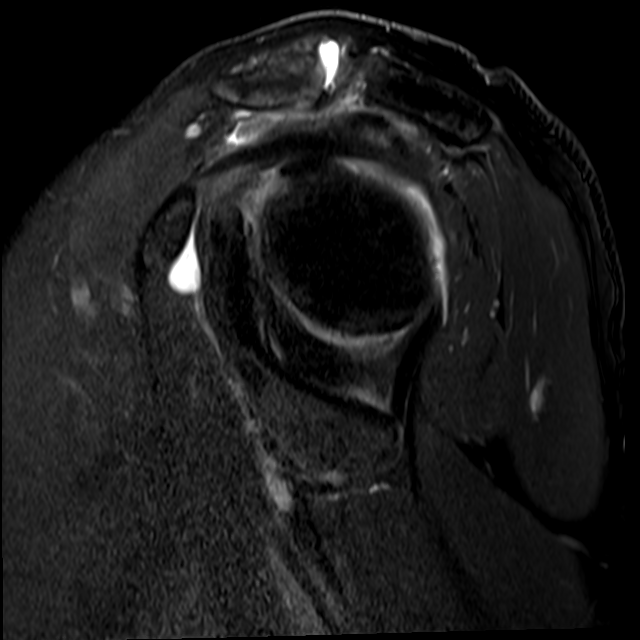
[im 14/24]
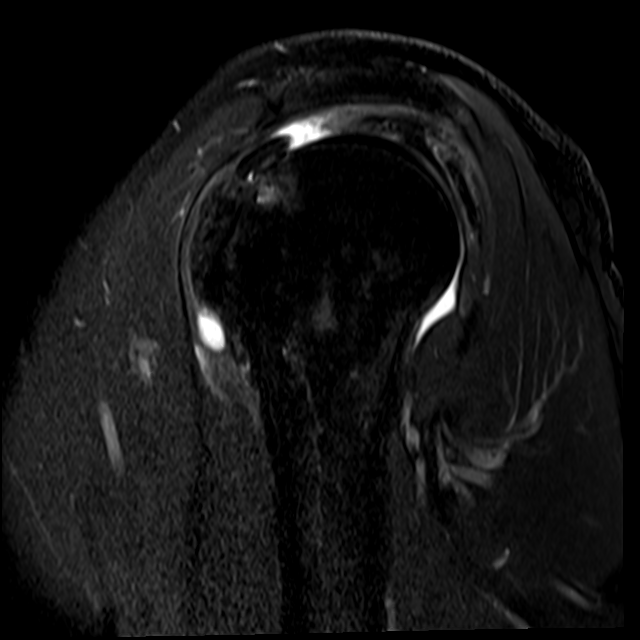
[im 20/24]
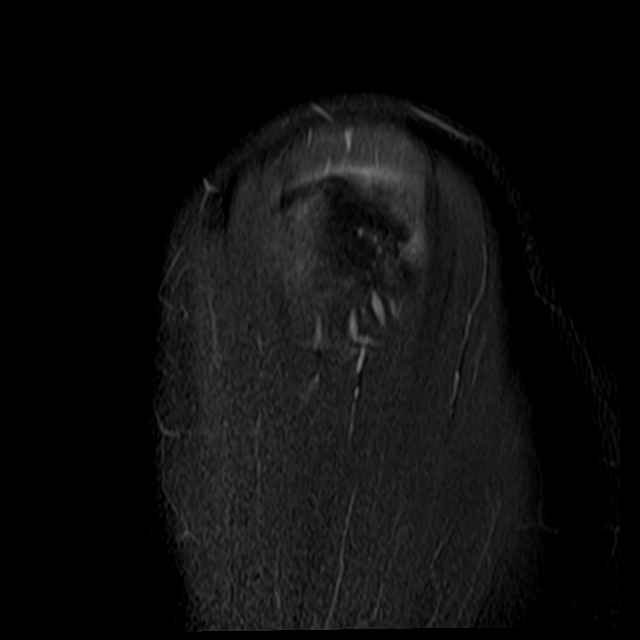

[30 of 40 positions shown; findings below may reference images not displayed]

FINDINGS: Rotator cuff: There is a full-thickness tear of the mid to anterior
aspect of the supraspinatus at the tendon critical zone measuring up
to 15 mm in AP dimension (sagittal series 8, image 15) and with a
tendon gap of 7 mm (coronal series 7, image 14). There is moderate
intermediate T2 signal more distal tendinosis. The infraspinatus,
subscapularis, and teres minor are intact.

Muscles:  Moderate anterior supraspinatus muscle atrophy.

Biceps long head: The intra-articular long head of the biceps tendon
is intact.

Acromioclavicular Joint: There are mild degenerative changes of the
acromioclavicular joint including joint space narrowing, subchondral
marrow edema, and peripheral osteophytosis. Type III acromion with
mild downsloping of the anterolateral acromion.

Glenohumeral Joint: Mild-to-moderate thinning of the inferior medial
humeral head cartilage. Mild thinning of the mid glenoid cartilage.

Labrum: Grossly intact, but evaluation is limited by lack of
intraarticular fluid.

Bones:  No acute fracture.

Other: None.
IMPRESSION: :
IMPRESSION: 1. Full-thickness tear of the mid to anterior aspect of the
supraspinatus tendon critical zone measuring up to 15 mm in AP
dimension.
2. Moderate anterior supraspinatus muscle atrophy.
3. Mild degenerative changes of the acromioclavicular joint.
4. Type 3 acromion with mild down-sloping of the anterolateral
acromion.

## 2021-06-03 ENCOUNTER — Other Ambulatory Visit: Payer: Self-pay | Admitting: Orthopedic Surgery

## 2021-06-27 ENCOUNTER — Other Ambulatory Visit: Payer: Self-pay

## 2021-06-27 ENCOUNTER — Encounter
Admission: RE | Admit: 2021-06-27 | Discharge: 2021-06-27 | Disposition: A | Discharge status: Home / Self Care | Payer: Medicare Other | Source: Ambulatory Visit | Attending: Orthopedic Surgery | Admitting: Orthopedic Surgery

## 2021-06-27 VITALS — Ht 70.0 in | Wt 161.0 lb

## 2021-06-27 DIAGNOSIS — Z01818 Encounter for other preprocedural examination: Secondary | ICD-10-CM

## 2021-06-27 DIAGNOSIS — K759 Inflammatory liver disease, unspecified: Secondary | ICD-10-CM

## 2021-06-27 HISTORY — DX: Inflammatory liver disease, unspecified: K75.9

## 2021-06-27 HISTORY — DX: Dorsalgia, unspecified: M54.9

## 2021-06-29 ENCOUNTER — Encounter
Admission: RE | Admit: 2021-06-29 | Discharge: 2021-06-29 | Disposition: A | Discharge status: Home / Self Care | Payer: Medicare Other | Source: Ambulatory Visit | Attending: Orthopedic Surgery | Admitting: Orthopedic Surgery

## 2021-06-29 ENCOUNTER — Encounter: Payer: Self-pay | Admitting: Urgent Care

## 2021-06-29 DIAGNOSIS — Z01818 Encounter for other preprocedural examination: Secondary | ICD-10-CM | POA: Insufficient documentation

## 2021-06-29 DIAGNOSIS — K759 Inflammatory liver disease, unspecified: Secondary | ICD-10-CM | POA: Insufficient documentation

## 2021-06-29 LAB — CBC
HCT: 36.4 % — ABNORMAL LOW (ref 39.0–52.0)
Hemoglobin: 11.8 g/dL — ABNORMAL LOW (ref 13.0–17.0)
MCH: 28.9 pg (ref 26.0–34.0)
MCHC: 32.4 g/dL (ref 30.0–36.0)
MCV: 89 fL (ref 80.0–100.0)
Platelets: 160 10*3/uL (ref 150–400)
RBC: 4.09 MIL/uL — ABNORMAL LOW (ref 4.22–5.81)
RDW: 14.5 % (ref 11.5–15.5)
WBC: 5.4 10*3/uL (ref 4.0–10.5)
nRBC: 0 % (ref 0.0–0.2)

## 2021-06-29 LAB — BASIC METABOLIC PANEL
Anion gap: 6 (ref 5–15)
BUN: 20 mg/dL (ref 8–23)
CO2: 27 mmol/L (ref 22–32)
Calcium: 9.7 mg/dL (ref 8.9–10.3)
Chloride: 106 mmol/L (ref 98–111)
Creatinine, Ser: 1.26 mg/dL — ABNORMAL HIGH (ref 0.61–1.24)
GFR, Estimated: >60 mL/min (ref >60)
Glucose, Bld: 105 mg/dL — ABNORMAL HIGH (ref 70–99)
Potassium: 4.5 mmol/L (ref 3.5–5.1)
Sodium: 139 mmol/L (ref 135–145)

## 2021-07-06 MED ORDER — CEFAZOLIN SODIUM-DEXTROSE 2-4 GM/100ML-% IV SOLN
2.0000 g | INTRAVENOUS | Status: AC
  Administered 2021-07-07: 2 g via INTRAVENOUS

## 2021-07-06 MED ORDER — CHLORHEXIDINE GLUCONATE 0.12 % MT SOLN: 15.0000 mL | Freq: Once | OROMUCOSAL | Status: AC

## 2021-07-06 MED ORDER — ORAL CARE MOUTH RINSE: 15.0000 mL | Freq: Once | OROMUCOSAL | Status: AC

## 2021-07-06 MED ORDER — FAMOTIDINE 20 MG PO TABS: 20.0000 mg | ORAL_TABLET | Freq: Once | ORAL | Status: AC

## 2021-07-06 MED ORDER — LACTATED RINGERS IV SOLN: INTRAVENOUS | Status: DC

## 2021-07-07 ENCOUNTER — Ambulatory Visit: Payer: Medicare Other | Admitting: Certified Registered"

## 2021-07-07 ENCOUNTER — Ambulatory Visit: Payer: Medicare Other

## 2021-07-07 ENCOUNTER — Encounter: Payer: Self-pay | Admitting: Orthopedic Surgery

## 2021-07-07 ENCOUNTER — Other Ambulatory Visit: Payer: Self-pay

## 2021-07-07 ENCOUNTER — Ambulatory Visit
Admission: RE | Admit: 2021-07-07 | Discharge: 2021-07-07 | Disposition: A | Discharge status: Home / Self Care | Payer: Medicare Other | Attending: Orthopedic Surgery | Admitting: Orthopedic Surgery

## 2021-07-07 ENCOUNTER — Encounter
Admission: RE | Disposition: A | Discharge status: Home / Self Care | Payer: Self-pay | Source: Home / Self Care | Attending: Orthopedic Surgery

## 2021-07-07 DIAGNOSIS — S46012A Strain of muscle(s) and tendon(s) of the rotator cuff of left shoulder, initial encounter: Secondary | ICD-10-CM | POA: Diagnosis not present

## 2021-07-07 DIAGNOSIS — Z79621 Long term (current) use of calcineurin inhibitor: Secondary | ICD-10-CM | POA: Insufficient documentation

## 2021-07-07 DIAGNOSIS — W19XXXA Unspecified fall, initial encounter: Secondary | ICD-10-CM | POA: Diagnosis not present

## 2021-07-07 DIAGNOSIS — Z944 Liver transplant status: Secondary | ICD-10-CM | POA: Diagnosis not present

## 2021-07-07 DIAGNOSIS — M25812 Other specified joint disorders, left shoulder: Secondary | ICD-10-CM | POA: Diagnosis present

## 2021-07-07 HISTORY — PX: SHOULDER ARTHROSCOPY WITH OPEN ROTATOR CUFF REPAIR: SHX6092

## 2021-07-07 SURGERY — ARTHROSCOPY, SHOULDER WITH REPAIR, ROTATOR CUFF, OPEN
Anesthesia: General | Site: Shoulder | Laterality: Left

## 2021-07-07 MED ORDER — FENTANYL CITRATE (PF) 100 MCG/2ML IJ SOLN: 25.0000 ug | INTRAMUSCULAR | Status: DC | PRN

## 2021-07-07 MED ORDER — PROPOFOL 1000 MG/100ML IV EMUL
INTRAVENOUS | Status: AC
  Filled 2021-07-07: qty 100

## 2021-07-07 MED ORDER — PROPOFOL 10 MG/ML IV BOLUS
INTRAVENOUS | Status: DC | PRN
  Administered 2021-07-07: 140 ug/kg/min via INTRAVENOUS
  Administered 2021-07-07: 120 mg via INTRAVENOUS

## 2021-07-07 MED ORDER — ONDANSETRON HCL 4 MG/2ML IJ SOLN
INTRAMUSCULAR | Status: DC | PRN
  Administered 2021-07-07: 4 mg via INTRAVENOUS

## 2021-07-07 MED ORDER — LIDOCAINE HCL (PF) 2 % IJ SOLN
INTRAMUSCULAR | Status: AC
  Filled 2021-07-07: qty 5

## 2021-07-07 MED ORDER — LABETALOL HCL 5 MG/ML IV SOLN
INTRAVENOUS | Status: DC | PRN
  Administered 2021-07-07: 5 mg via INTRAVENOUS

## 2021-07-07 MED ORDER — FENTANYL CITRATE PF 50 MCG/ML IJ SOSY
PREFILLED_SYRINGE | INTRAMUSCULAR | Status: AC
  Administered 2021-07-07: 50 ug
  Filled 2021-07-07: qty 1

## 2021-07-07 MED ORDER — CEFAZOLIN SODIUM-DEXTROSE 2-4 GM/100ML-% IV SOLN
INTRAVENOUS | Status: AC
  Filled 2021-07-07: qty 100

## 2021-07-07 MED ORDER — LACTATED RINGERS IR SOLN
Status: DC | PRN
  Administered 2021-07-07 (×4): 3001 mL

## 2021-07-07 MED ORDER — FAMOTIDINE 20 MG PO TABS
ORAL_TABLET | ORAL | Status: AC
  Administered 2021-07-07: 20 mg via ORAL
  Filled 2021-07-07: qty 1

## 2021-07-07 MED ORDER — PHENYLEPHRINE 80 MCG/ML (10ML) SYRINGE FOR IV PUSH (FOR BLOOD PRESSURE SUPPORT)
PREFILLED_SYRINGE | INTRAVENOUS | Status: AC
  Filled 2021-07-07: qty 10

## 2021-07-07 MED ORDER — LIDOCAINE HCL (PF) 1 % IJ SOLN
INTRAMUSCULAR | Status: AC
  Filled 2021-07-07: qty 5

## 2021-07-07 MED ORDER — CHLORHEXIDINE GLUCONATE 0.12 % MT SOLN
OROMUCOSAL | Status: AC
  Administered 2021-07-07: 15 mL via OROMUCOSAL
  Filled 2021-07-07: qty 15

## 2021-07-07 MED ORDER — FENTANYL CITRATE (PF) 100 MCG/2ML IJ SOLN
INTRAMUSCULAR | Status: DC | PRN
  Administered 2021-07-07 (×2): 50 ug via INTRAVENOUS

## 2021-07-07 MED ORDER — ACETAMINOPHEN 500 MG PO TABS: 1000.0000 mg | ORAL_TABLET | Freq: Two times a day (BID) | ORAL | 1 refills | Status: AC

## 2021-07-07 MED ORDER — EPINEPHRINE PF 1 MG/ML IJ SOLN
INTRAMUSCULAR | Status: AC
  Filled 2021-07-07: qty 4

## 2021-07-07 MED ORDER — ROCURONIUM BROMIDE 10 MG/ML (PF) SYRINGE
PREFILLED_SYRINGE | INTRAVENOUS | Status: AC
  Filled 2021-07-07: qty 10

## 2021-07-07 MED ORDER — ONDANSETRON 4 MG PO TBDP: 4.0000 mg | ORAL_TABLET | Freq: Three times a day (TID) | ORAL | 0 refills | Status: DC | PRN

## 2021-07-07 MED ORDER — ASPIRIN 325 MG PO TBEC: 325.0000 mg | DELAYED_RELEASE_TABLET | Freq: Every day | ORAL | 0 refills | Status: AC

## 2021-07-07 MED ORDER — DEXAMETHASONE SODIUM PHOSPHATE 10 MG/ML IJ SOLN
INTRAMUSCULAR | Status: AC
  Filled 2021-07-07: qty 1

## 2021-07-07 MED ORDER — 0.9 % SODIUM CHLORIDE (POUR BTL) OPTIME
TOPICAL | Status: DC | PRN
  Administered 2021-07-07: 1000 mL

## 2021-07-07 MED ORDER — BUPIVACAINE HCL (PF) 0.5 % IJ SOLN
INTRAMUSCULAR | Status: DC | PRN
  Administered 2021-07-07: 10 mL via PERINEURAL

## 2021-07-07 MED ORDER — GLYCOPYRROLATE 0.2 MG/ML IJ SOLN
INTRAMUSCULAR | Status: AC
Start: 2021-07-07 — End: ?
  Filled 2021-07-07: qty 1

## 2021-07-07 MED ORDER — MIDAZOLAM HCL 2 MG/2ML IJ SOLN
1.0000 mg | INTRAMUSCULAR | Status: AC | PRN
  Administered 2021-07-07: 1 mg via INTRAVENOUS

## 2021-07-07 MED ORDER — BUPIVACAINE HCL (PF) 0.5 % IJ SOLN
INTRAMUSCULAR | Status: AC
  Filled 2021-07-07: qty 10

## 2021-07-07 MED ORDER — PROMETHAZINE HCL 25 MG/ML IJ SOLN: 6.2500 mg | INTRAMUSCULAR | Status: DC | PRN

## 2021-07-07 MED ORDER — SUGAMMADEX SODIUM 200 MG/2ML IV SOLN
INTRAVENOUS | Status: DC | PRN
  Administered 2021-07-07: 200 mg via INTRAVENOUS

## 2021-07-07 MED ORDER — MIDAZOLAM HCL 2 MG/2ML IJ SOLN
INTRAMUSCULAR | Status: AC
  Administered 2021-07-07: 1 mg via INTRAVENOUS
  Filled 2021-07-07: qty 2

## 2021-07-07 MED ORDER — LIDOCAINE HCL (PF) 1 % IJ SOLN
INTRAMUSCULAR | Status: DC | PRN
  Administered 2021-07-07: 4 mL via SUBCUTANEOUS

## 2021-07-07 MED ORDER — LIDOCAINE HCL (CARDIAC) PF 100 MG/5ML IV SOSY
PREFILLED_SYRINGE | INTRAVENOUS | Status: DC | PRN
  Administered 2021-07-07: 100 mg via INTRAVENOUS

## 2021-07-07 MED ORDER — LACTATED RINGERS IR SOLN
Status: DC | PRN
  Administered 2021-07-07 (×2): 3000 mL

## 2021-07-07 MED ORDER — BUPIVACAINE LIPOSOME 1.3 % IJ SUSP
INTRAMUSCULAR | Status: AC
  Filled 2021-07-07: qty 20

## 2021-07-07 MED ORDER — DEXAMETHASONE SODIUM PHOSPHATE 10 MG/ML IJ SOLN
INTRAMUSCULAR | Status: DC | PRN
  Administered 2021-07-07: 10 mg via INTRAVENOUS

## 2021-07-07 MED ORDER — EPHEDRINE 5 MG/ML INJ
INTRAVENOUS | Status: AC
  Filled 2021-07-07: qty 5

## 2021-07-07 MED ORDER — GLYCOPYRROLATE 0.2 MG/ML IJ SOLN
INTRAMUSCULAR | Status: DC | PRN
  Administered 2021-07-07: .2 mg via INTRAVENOUS

## 2021-07-07 MED ORDER — BUPIVACAINE LIPOSOME 1.3 % IJ SUSP
INTRAMUSCULAR | Status: DC | PRN
  Administered 2021-07-07: 20 mL via PERINEURAL

## 2021-07-07 MED ORDER — ONDANSETRON HCL 4 MG/2ML IJ SOLN
INTRAMUSCULAR | Status: AC
  Filled 2021-07-07: qty 2

## 2021-07-07 MED ORDER — LABETALOL HCL 5 MG/ML IV SOLN
INTRAVENOUS | Status: AC
  Filled 2021-07-07: qty 4

## 2021-07-07 MED ORDER — FENTANYL CITRATE PF 50 MCG/ML IJ SOSY: 50.0000 ug | PREFILLED_SYRINGE | Freq: Once | INTRAMUSCULAR | Status: AC

## 2021-07-07 MED ORDER — FENTANYL CITRATE (PF) 100 MCG/2ML IJ SOLN
INTRAMUSCULAR | Status: AC
  Filled 2021-07-07: qty 2

## 2021-07-07 MED ORDER — PROPOFOL 10 MG/ML IV BOLUS
INTRAVENOUS | Status: AC
  Filled 2021-07-07: qty 20

## 2021-07-07 MED ORDER — PHENYLEPHRINE HCL (PRESSORS) 10 MG/ML IV SOLN
INTRAVENOUS | Status: DC | PRN
  Administered 2021-07-07: 80 ug via INTRAVENOUS

## 2021-07-07 MED ORDER — ROCURONIUM BROMIDE 100 MG/10ML IV SOLN
INTRAVENOUS | Status: DC | PRN
  Administered 2021-07-07: 60 mg via INTRAVENOUS
  Administered 2021-07-07: 20 mg via INTRAVENOUS

## 2021-07-07 MED ORDER — OXYCODONE HCL 5 MG PO TABS: 5.0000 mg | ORAL_TABLET | ORAL | 0 refills | Status: AC | PRN

## 2021-07-07 SURGICAL SUPPLY — 73 items
ADAPTER IRRIG TUBE 2 SPIKE SOL (ADAPTER) ×4 IMPLANT
ANCHOR 2.3 SP SGL 1.2 XBRAID (Anchor) ×2 IMPLANT
ANCHOR SWIVELOCK BIO 4.75X19.1 (Anchor) ×2 IMPLANT
BLADE OSCILLATING/SAGITTAL (BLADE)
BLADE SHAVER 4.5X7 STR FR (MISCELLANEOUS) ×2 IMPLANT
BLADE SW THK.38XMED LNG THN (BLADE) IMPLANT
BNDG ADH 2 X3.75 FABRIC TAN LF (GAUZE/BANDAGES/DRESSINGS) ×2 IMPLANT
BUR BR 5.5 WIDE MOUTH (BURR) IMPLANT
CANNULA PART THRD DISP 5.75X7 (CANNULA) IMPLANT
CANNULA PARTIAL THREAD 2X7 (CANNULA) ×2 IMPLANT
CANNULA TWIST IN 8.25X9CM (CANNULA) IMPLANT
CHLORAPREP W/TINT 26 (MISCELLANEOUS) ×2 IMPLANT
COOLER POLAR GLACIER W/PUMP (MISCELLANEOUS) ×2 IMPLANT
COVER LIGHT HANDLE STERIS (MISCELLANEOUS) ×2 IMPLANT
DERMABOND ADVANCED (GAUZE/BANDAGES/DRESSINGS)
DERMABOND ADVANCED .7 DNX12 (GAUZE/BANDAGES/DRESSINGS) IMPLANT
DRAPE INCISE IOBAN 66X45 STRL (DRAPES) ×2 IMPLANT
DRAPE U-SHAPE 47X51 STRL (DRAPES) ×4 IMPLANT
DRSG TEGADERM 4X4.75 (GAUZE/BANDAGES/DRESSINGS) ×1 IMPLANT
ELECT REM PT RETURN 9FT ADLT (ELECTROSURGICAL) ×2
ELECTRODE REM PT RTRN 9FT ADLT (ELECTROSURGICAL) ×1 IMPLANT
GAUZE SPONGE 4X4 12PLY STRL (GAUZE/BANDAGES/DRESSINGS) ×2 IMPLANT
GAUZE XEROFORM 1X8 LF (GAUZE/BANDAGES/DRESSINGS) ×2 IMPLANT
GLOVE BIO SURGEON STRL SZ7.5 (GLOVE) ×4 IMPLANT
GLOVE BIOGEL PI IND STRL 8 (GLOVE) ×2 IMPLANT
GLOVE BIOGEL PI INDICATOR 8 (GLOVE) ×2
GLOVE SURG SYN 8.0 (GLOVE) ×4 IMPLANT
GLOVE SURG SYN 8.0 PF PI (GLOVE) ×2 IMPLANT
GOWN STRL REUS W/ TWL LRG LVL3 (GOWN DISPOSABLE) ×2 IMPLANT
GOWN STRL REUS W/TWL LRG LVL3 (GOWN DISPOSABLE) ×2
GOWN STRL REUS W/TWL XL LVL4 (GOWN DISPOSABLE) ×2 IMPLANT
IV LACTATED RINGER IRRG 3000ML (IV SOLUTION) ×4
IV LR IRRIG 3000ML ARTHROMATIC (IV SOLUTION) ×4 IMPLANT
KIT STABILIZATION SHOULDER (MISCELLANEOUS) ×2 IMPLANT
KIT TURNOVER KIT A (KITS) ×2 IMPLANT
MANIFOLD NEPTUNE II (INSTRUMENTS) ×4 IMPLANT
MASK FACE SPIDER DISP (MASK) ×2 IMPLANT
MAT ABSORB  FLUID 56X50 GRAY (MISCELLANEOUS) ×2
MAT ABSORB FLUID 56X50 GRAY (MISCELLANEOUS) ×2 IMPLANT
NDL MAYO 6 CRC TAPER PT (NEEDLE) IMPLANT
NDL SCORPION MULTI FIRE (NEEDLE) IMPLANT
NEEDLE HYPO 22GX1.5 SAFETY (NEEDLE) ×2 IMPLANT
NEEDLE MAYO 6 CRC TAPER PT (NEEDLE) IMPLANT
NEEDLE SCORPION MULTI FIRE (NEEDLE) IMPLANT
PACK ARTHROSCOPY SHOULDER (MISCELLANEOUS) ×2 IMPLANT
PAD ABD DERMACEA PRESS 5X9 (GAUZE/BANDAGES/DRESSINGS) ×2 IMPLANT
PAD ARMBOARD 7.5X6 YLW CONV (MISCELLANEOUS) ×4 IMPLANT
PAD WRAPON POLAR SHDR XLG (MISCELLANEOUS) ×1 IMPLANT
PASSER SUT FIRSTPASS SELF (INSTRUMENTS) ×3 IMPLANT
SHAVER BLADE BONE CUTTER  5.5 (BLADE)
SHAVER BLADE BONE CUTTER 5.5 (BLADE) IMPLANT
SLING ULTRA II M (MISCELLANEOUS) ×2 IMPLANT
SPONGE T-LAP 18X18 ~~LOC~~+RFID (SPONGE) ×2 IMPLANT
STRAP SAFETY 5IN WIDE (MISCELLANEOUS) ×2 IMPLANT
SUT ETHILON 3-0 (SUTURE) ×2 IMPLANT
SUT LASSO 90 DEG SD STR (SUTURE) IMPLANT
SUT MNCRL 4-0 (SUTURE)
SUT MNCRL 4-0 27XMFL (SUTURE)
SUT PROLENE 0 CT 2 (SUTURE) IMPLANT
SUT TICRON 2-0 30IN 311381 (SUTURE) IMPLANT
SUT VIC AB 0 CT1 36 (SUTURE) ×1 IMPLANT
SUT VIC AB 2-0 CT2 27 (SUTURE) ×1 IMPLANT
SUTURE MNCRL 4-0 27XMF (SUTURE) IMPLANT
SUTURE TAPE 1.3 40 TPR END (SUTURE) IMPLANT
SUTURETAPE 1.3 40 TPR END (SUTURE)
SYR 10ML LL (SYRINGE) IMPLANT
SYSTEM FBRTK BICEPS 1.9 DRILL (Anchor) ×2 IMPLANT
TAPE MICROFOAM 4IN (TAPE) ×2 IMPLANT
TUBING INFLOW SET DBFLO PUMP (TUBING) ×2 IMPLANT
TUBING OUTFLOW SET DBLFO PUMP (TUBING) ×2 IMPLANT
WAND WEREWOLF FLOW 90D (MISCELLANEOUS) ×2 IMPLANT
WATER STERILE IRR 500ML POUR (IV SOLUTION) ×2 IMPLANT
WRAPON POLAR PAD SHDR XLG (MISCELLANEOUS) ×2

## 2021-07-07 NOTE — Transfer of Care (Signed)
Immediate Anesthesia Transfer of Care Note  Patient: Jaime Powell  Procedure(s) Performed: Left shoulder arthroscopic vs mini open rotator cuff repair, biceps tenodesis, subacromial decompression (Left: Shoulder)  Patient Location: PACU  Anesthesia Type:General  Level of Consciousness: drowsy  Airway & Oxygen Therapy: Patient Spontanous Breathing and Patient connected to face mask oxygen  Post-op Assessment: Report given to RN and Post -op Vital signs reviewed and stable  Post vital signs: Reviewed and stable  Last Vitals:  Vitals Value Taken Time  BP 108/64 07/07/21 1549  Temp 35.9 1549  Pulse 55 07/07/21 1554  Resp 11 07/07/21 1554  SpO2 100 % 07/07/21 1554  Vitals shown include unvalidated device data.  Last Pain:  Vitals:   07/07/21 0929  TempSrc: Temporal  PainSc: 0-No pain         Complications: No notable events documented.

## 2021-07-07 NOTE — Anesthesia Procedure Notes (Signed)
Procedure Name: Intubation Date/Time: 07/07/2021 1:07 PM  Performed by: Cammie Sickle, CRNAPre-anesthesia Checklist: Patient identified, Patient being monitored, Timeout performed, Emergency Drugs available and Suction available Patient Re-evaluated:Patient Re-evaluated prior to induction Oxygen Delivery Method: Circle system utilized Preoxygenation: Pre-oxygenation with 100% oxygen Induction Type: IV induction Ventilation: Mask ventilation without difficulty and Oral airway inserted - appropriate to patient size Laryngoscope Size: 3 and McGraph Grade View: Grade I Tube type: Oral Tube size: 7.0 mm Number of attempts: 1 Airway Equipment and Method: Stylet Placement Confirmation: ETT inserted through vocal cords under direct vision, positive ETCO2 and breath sounds checked- equal and bilateral Secured at: 22 cm Tube secured with: Tape Dental Injury: Teeth and Oropharynx as per pre-operative assessment

## 2021-07-07 NOTE — H&P (Signed)
Paper H&P to be scanned into permanent record. H&P reviewed. No significant changes noted.  

## 2021-07-07 NOTE — Anesthesia Preprocedure Evaluation (Signed)
Anesthesia Evaluation  Patient identified by MRN, date of birth, ID band Patient awake    Reviewed: Allergy & Precautions, H&P , NPO status , Patient's Chart, lab work & pertinent test results, reviewed documented beta blocker date and time   History of Anesthesia Complications Negative for: history of anesthetic complications  Airway Mallampati: II  TM Distance: >3 FB Neck ROM: full    Dental  (+) Dental Advidsory Given, Edentulous Upper, Edentulous Lower, Upper Dentures   Pulmonary neg pulmonary ROS, neg shortness of breath, neg COPD, neg recent URI, former smoker,    Pulmonary exam normal breath sounds clear to auscultation       Cardiovascular Exercise Tolerance: Good negative cardio ROS Normal cardiovascular exam Rhythm:regular Rate:Normal     Neuro/Psych negative neurological ROS  negative psych ROS   GI/Hepatic negative GI ROS, (+) neg Cirrhosis      , Hepatitis -S/p liver transplant   Endo/Other  negative endocrine ROS  Renal/GU negative Renal ROS  negative genitourinary   Musculoskeletal   Abdominal   Peds  Hematology negative hematology ROS (+)   Anesthesia Other Findings Past Medical History: No date: degenerative disc disease No date: Hepatitis 2022: Liver transplanted ([place]) 2019: Sepsis ([place])   Reproductive/Obstetrics negative OB ROS                             Anesthesia Physical Anesthesia Plan  ASA: 2  Anesthesia Plan: General   Post-op Pain Management: Regional block*   Induction: Intravenous  PONV Risk Score and Plan: 2 and Ondansetron, Dexamethasone, Midazolam and Treatment may vary due to age or medical condition  Airway Management Planned: Oral ETT  Additional Equipment:   Intra-op Plan:   Post-operative Plan: Extubation in OR  Informed Consent: I have reviewed the patients History and Physical, chart, labs and discussed the procedure including  the risks, benefits and alternatives for the proposed anesthesia with the patient or authorized representative who has indicated his/her understanding and acceptance.     Dental Advisory Given  Plan Discussed with: Anesthesiologist, CRNA and Surgeon  Anesthesia Plan Comments:         Anesthesia Quick Evaluation

## 2021-07-07 NOTE — Discharge Instructions (Addendum)
Post-Op Instructions - Rotator Cuff Repair  1. Bracing: You will wear a shoulder immobilizer or sling for 6 weeks.   2. Driving: No driving for 3 weeks post-op. When driving, do not wear the immobilizer. Ideally, we recommend no driving for 6 weeks while sling is in place as one arm will be immobilized.   3. Activity: No active lifting for 2 months. Wrist, hand, and elbow motion only. Avoid lifting the upper arm away from the body except for hygiene. You are permitted to bend and straighten the elbow passively only (no active elbow motion). You may use your hand and wrist for typing, writing, and managing utensils (cutting food). Do not lift more than a coffee cup for 8 weeks.  When sleeping or resting, inclined positions (recliner chair or wedge pillow) and a pillow under the forearm for support may provide better comfort for up to 4 weeks.  Avoid long distance travel for 4 weeks.  Return to normal activities after rotator cuff repair repair normally takes 6 months on average. If rehab goes very well, may be able to do most activities at 4 months, except overhead or contact sports.  4. Physical Therapy: Begins 3-4 days after surgery, and proceed 1 time per week for the first 6 weeks, then 1-2 times per week from weeks 6-20 post-op.  5. Medications:  - You will be provided a prescription for narcotic pain medicine. After surgery, take 1-2 narcotic tablets every 4 hours if needed for severe pain.  - A prescription for anti-nausea medication will be provided in case the narcotic medicine causes nausea - take 1 tablet every 6 hours only if nauseated.   - Take tylenol 1000 mg (2 Extra Strength tablets or 3 regular strength) every 12 hours for pain.  May decrease or stop tylenol when you are having minimal pain. - Take ASA '325mg'$ /day x 2 weeks to help prevent DVTs/PEs (blood clots).  - DO NOT take ANY nonsteroidal anti-inflammatory pain medications (Advil, Motrin, Ibuprofen, Aleve, Naproxen, or Naprosyn).  These medicines can inhibit healing of your shoulder repair.    If you are taking prescription medication for anxiety, depression, insomnia, muscle spasm, chronic pain, or for attention deficit disorder, you are advised that you are at a higher risk of adverse effects with use of narcotics post-op, including narcotic addiction/dependence, depressed breathing, death. If you use non-prescribed substances: alcohol, marijuana, cocaine, heroin, methamphetamines, etc., you are at a higher risk of adverse effects with use of narcotics post-op, including narcotic addiction/dependence, depressed breathing, death. You are advised that taking > 50 morphine milligram equivalents (MME) of narcotic pain medication per day results in twice the risk of overdose or death. For your prescription provided: oxycodone 5 mg - taking more than 6 tablets per day would result in > 50 morphine milligram equivalents (MME) of narcotic pain medication. Be advised that we will prescribe narcotics short-term, for acute post-operative pain only - 3 weeks for major operations such as shoulder repair/reconstruction surgeries.     6. Post-Op Appointment:  Your first post-op appointment will be 10-14 days post-op.  7. Work or School: For most, but not all procedures, we advise staying out of work or school for at least 1 to 2 weeks in order to recover from the stress of surgery and to allow time for healing.   If you need a work or school note this can be provided.   8. Smoking: If you are a smoker, you need to refrain from smoking in the postoperative  period. The nicotine in cigarettes will inhibit healing of your shoulder repair and decrease the chance of successful repair. Similarly, nicotine containing products (gum, patches) should be avoided.   Post-operative Brace: Apply and remove the brace you received as you were instructed to at the time of fitting and as described in detail as the brace's instructions for use indicate.   Wear the brace for the period of time prescribed by your physician.  The brace can be cleaned with soap and water and allowed to air dry only.  Should the brace result in increased pain, decreased feeling (numbness/tingling), increased swelling or an overall worsening of your medical condition, please contact your doctor immediately.  If an emergency situation occurs as a result of wearing the brace after normal business hours, please dial 911 and seek immediate medical attention.  Let your doctor know if you have any further questions about the brace issued to you. Refer to the shoulder sling instructions for use if you have any questions regarding the correct fit of your shoulder sling.  [facility]: [phone]  Video that illustrates how to properly use a shoulder sling: "Instructions for Proper Use of an Orthopaedic Sling" [email or web address]   AMBULATORY SURGERY  DISCHARGE INSTRUCTIONS   The drugs that you were given will stay in your system until tomorrow so for the next 24 hours you should not:  Drive an automobile Make any legal decisions Drink any alcoholic beverage   You may resume regular meals tomorrow.  Today it is better to start with liquids and gradually work up to solid foods.  You may eat anything you prefer, but it is better to start with liquids, then soup and crackers, and gradually work up to solid foods.   Please notify your doctor immediately if you have any unusual bleeding, trouble breathing, redness and pain at the surgery site, drainage, fever, or pain not relieved by medication.    Additional Instructions:        Please contact your physician with any problems or Same Day Surgery at [phone], Monday through Friday 6 am to 4 pm, or [facility] at [facility] number at [phone].

## 2021-07-07 NOTE — Op Note (Signed)
SURGERY DATE: 07/07/2021  PRE-OP DIAGNOSIS:  1. Left subacromial impingement 2. Left biceps tendinopathy 3. Left rotator cuff tear  POST-OP DIAGNOSIS: 1. Left subacromial impingement 2. Left biceps tendinopathy 3. Left rotator cuff tear  PROCEDURES:  1. Left mini-open rotator cuff repair 2. Left open biceps tenodesis 3. Left arthroscopic extensive debridement of shoulder (glenohumeral and subacromial spaces) 4. Left arthroscopic subacromial decompression  SURGEON: Cato Mulligan, MD  ANESTHESIA: Gen with Exparel interscalene block  ESTIMATED BLOOD LOSS: 25cc  DRAINS:  none  TOTAL IV FLUIDS: per anesthesia   SPECIMENS: none  IMPLANTS:  - Arthrex 4.455m SwiveLock x2 - Arthrex 1.826mFiberTak - Iconix SPEED double loaded with 1.2 and 2.55m81mape x 2   OPERATIVE FINDINGS:  Examination under anesthesia: A careful examination under anesthesia was performed.  Passive range of motion was: FF: 150; ER at side: 45; ER in abduction: 90; IR in abduction: 45.  Anterior load shift: NT.  Posterior load shift: NT.  Sulcus in neutral: NT.  Sulcus in ER: NT.    Intra-operative findings: A thorough arthroscopic examination of the shoulder was performed.  The findings are: 1. Biceps tendon: tendinopathy with significant erythema, fraying, and thickening 2. Superior labrum: injected with surrounding synovitis 3. Posterior labrum and capsule: normal 4. Inferior capsule and inferior recess: normal 5. Glenoid cartilage surface: Grade 1 changes with mild surface fibrillation inferiorly 6. Supraspinatus attachment: full-thickness tear 7. Posterior rotator cuff attachment: normal 8. Humeral head articular cartilage: normal 9. Rotator interval: significant synovitis 10: Subscapularis tendon: attachment intact, although with mild degenerative frayed fibers of the superior border 11. Anterior labrum: degenerative 12. IGHL: normal  OPERATIVE REPORT:   Indications for procedure: Jaime Powell a  66 109o. male with ~9 months of L shoulder pain that has failed non-operative management including activity modification, physical therapy, medical management and corticosteroid injection without adequate relief of symptoms.  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  Clinical exam and MRI were suggestive of rotator cuff tear, biceps tendinopathy, and subacromial impingement. After discussion of risks, benefits, and alternatives to surgery, the patient elected to proceed.   Procedure in detail:  I identified Jaime Powell the pre-operative holding area.  I marked the operative shoulder with my initials. I reviewed the risks and benefits of the proposed surgical intervention, and the patient (and/or patient's guardian) wished to proceed.  Anesthesia was then performed with an Exparel interscalene block.  The patient was transferred to the operative suite and placed in the beach chair position.    SCDs were placed on the lower extremities. Appropriate IV antibiotics were administered prior to incision. The operative upper extremity was then prepped and draped in standard fashion. A time out was performed confirming the correct extremity, correct patient, and correct procedure.   I then created a standard posterior portal with an 11 blade. The glenohumeral joint was easily entered with a blunt trochar and the arthroscope introduced. The findings of diagnostic arthroscopy are described above. I debrided degenerative tissue including the synovitic tissue about the rotator interval and anterior and superior labrum. I then coagulated the inflamed synovium to obtain hemostasis and reduce the risk of post-operative swelling using an Arthrocare radiofrequency device. I performed a biceps tenotomy using an arthroscopic scissors and used a motorized shaver to debride the stump back to a stable base at the biceps anchor  complex.  Degenerative regions of the glenoid were  also debrided with an oscillating shaver such that there were stable cartilaginous edges.  Next, the arthroscope was then introduced into the subacromial space. A direct lateral portal was created with an 11-blade after spinal needle localization. An extensive subacromial bursectomy and debridement was performed using a combination of the shaver and Arthrocare wand. The entire acromial undersurface was exposed and the CA ligament was subperiosteally elevated to expose the anterior acromial hook. A 5.19m barrel burr was used to create a flat anterior and lateral aspect of the acromion, converting it from a Type 3 to a Type 1 acromion. Care was made to keep the deltoid fascia intact.  A longitudinal incision from the anterolateral acromion ~6cm in length was made overlying the raphe between the anterior and middle heads of the deltoid. The raphe was identified and it was incised. The subacromial space was identified. Any remaining bursa was excised. The rotator cuff tear was identified. It was an L-shaped tear with the long limb of the L anterior.  As the tear traveled posteriorly, the line of tear was more medial near the musculotendinous junction such that there was a small remnant stump of anterior infraspinatus attachment.  We then turned our attention to the biceps tenodesis. The arm was externally rotated.  The bicipital groove was identified.  Given the size of the rotator cuff tear, the biceps tendon was identified readily.  The base of the bicipital groove was identified and cleared of soft tissue.  A FiberTak anchor was placed in the bicipital groove.  The biceps tendon was held at the appropriate amount of tension.  One set of sutures was passed through the biceps anchor with one limb passed in a simple fashion and the second limb passed in a simple plus locking stitch pattern.  This was repeated for the other set of sutures.  This construct allowed  for shuttling the biceps tendon down to the bone.  The sutures were tied and cut.  The diseased portion of the proximal biceps was then excised.  The arm was then internally rotated.  The rotator cuff footprint was cleared of soft tissue. A rongeur was used to gently decorticate the rotator cuff footprint to allow for improved healing. Two Iconix SPEED anchors were placed just lateral to the articular margin.  The rotator cuff was mobilized using key elevators both superior and inferior to the tear. The rotator cuff was able to be reduced to its footprint and then held in a reduced position with graspers. All 8 strands of suture from the medial row anchors were passed through the rotator cuff in an appropriate fashion.  The arm was abducted slightly.  Two SwiveLock anchors were placed for the lateral row anchors with one limb of each of the medial row sutures passed through each anchor.  2 side-to-side stitches were placed from the remnant lateral stump of the anterior infraspinatus to the medial rotator cuff tissue to further secure the repair.  This construct was stable with external and internal rotation and allowed for excellent reapproximation of the rotator cuff to its natural footprint..  The wound was thoroughly irrigated.  The deltoid split was closed with 0 Vicryl.  The subdermal layer was closed with 2-0 Vicryl.  The skin was closed with 4-0 Monocryl and Dermabond. The portals were closed with 3-0 Nylon. Xeroform was applied to the incisions. A sterile dressing was applied, followed by a Polar Care sleeve and a SlingShot shoulder immobilizer/sling. The patient was awakened from anesthesia without difficulty and was  transferred to the PACU in stable condition.     COMPLICATIONS: none  DISPOSITION: plan for discharge home after recovery in PACU   POSTOPERATIVE PLAN: Remain in sling (except hygiene and elbow/wrist/hand RoM exercises as instructed by PT) x 6 weeks and NWB for this time. PT to  begin 3-4 days after surgery.  Use large rotator cuff repair and biceps tenodesis rehab protocol. ASA '325mg'$  daily x 2 weeks for DVT ppx.

## 2021-07-07 NOTE — Anesthesia Procedure Notes (Signed)
Anesthesia Regional Block: Interscalene brachial plexus block   Pre-Anesthetic Checklist: , timeout performed,  Correct Patient, Correct Site, Correct Laterality,  Correct Procedure, Correct Position, site marked,  Risks and benefits discussed,  Surgical consent,  Pre-op evaluation,  At surgeon's request and post-op pain management  Laterality: Left and Upper  Prep: chloraprep       Needles:  Injection technique: Single-shot  Needle Type: Stimiplex     Needle Length: 5cm  Needle Gauge: 22     Additional Needles:   Procedures:,,,, ultrasound used (permanent image in chart),,    Narrative:  Start time: 07/07/2021 12:32 PM End time: 07/07/2021 12:35 PM Injection made incrementally with aspirations every 5 mL.  Performed by: Personally  Anesthesiologist: Martha Clan, MD  Additional Notes: Functioning IV was confirmed and monitors were applied.  A 35m 22ga Stimuplex needle was used. Sterile prep and drape,hand hygiene and sterile gloves were used.  Negative aspiration and negative test dose prior to incremental administration of local anesthetic. The patient tolerated the procedure well.

## 2021-07-08 ENCOUNTER — Encounter: Payer: Self-pay | Admitting: Orthopedic Surgery

## 2021-07-12 NOTE — Anesthesia Postprocedure Evaluation (Signed)
Anesthesia Post Note  Patient: Jaime Powell  Procedure(s) Performed: Left shoulder arthroscopic vs mini open rotator cuff repair, biceps tenodesis, subacromial decompression (Left: Shoulder)  Patient location during evaluation: PACU Anesthesia Type: General Level of consciousness: awake and alert Pain management: pain level controlled Vital Signs Assessment: post-procedure vital signs reviewed and stable Respiratory status: spontaneous breathing, nonlabored ventilation, respiratory function stable and patient connected to nasal cannula oxygen Cardiovascular status: blood pressure returned to baseline and stable Postop Assessment: no apparent nausea or vomiting Anesthetic complications: no   No notable events documented.   Last Vitals:  Vitals:   07/07/21 1615 07/07/21 1633  BP: 128/77 130/83  Pulse: (!) 56 64  Resp: 12 16  Temp: (!) 36.3 C (!) 36.3 C  SpO2: 98% 100%    Last Pain:  Vitals:   07/07/21 1633  TempSrc: Temporal  PainSc: 0-No pain                 Molli Barrows

## 2021-07-29 NOTE — Progress Notes (Signed)
Chronic. 

## 2021-09-14 ENCOUNTER — Other Ambulatory Visit: Payer: Self-pay | Admitting: Internal Medicine

## 2021-09-14 DIAGNOSIS — Z944 Liver transplant status: Secondary | ICD-10-CM

## 2021-09-14 DIAGNOSIS — R1011 Right upper quadrant pain: Secondary | ICD-10-CM

## 2021-09-22 ENCOUNTER — Ambulatory Visit
Admission: RE | Admit: 2021-09-22 | Discharge: 2021-09-22 | Disposition: A | Discharge status: Home / Self Care | Payer: Medicare Other | Source: Ambulatory Visit | Attending: Internal Medicine | Admitting: Internal Medicine

## 2021-09-22 DIAGNOSIS — R1011 Right upper quadrant pain: Secondary | ICD-10-CM | POA: Diagnosis present

## 2021-09-22 DIAGNOSIS — Z944 Liver transplant status: Secondary | ICD-10-CM | POA: Diagnosis present

## 2021-10-21 ENCOUNTER — Ambulatory Visit
Admission: RE | Admit: 2021-10-21 | Discharge: 2021-10-21 | Disposition: A | Discharge status: Home / Self Care | Payer: Medicare Other | Attending: Urology | Admitting: Urology

## 2021-10-21 ENCOUNTER — Ambulatory Visit (INDEPENDENT_AMBULATORY_CARE_PROVIDER_SITE_OTHER): Payer: Medicare Other | Admitting: Urology

## 2021-10-21 ENCOUNTER — Ambulatory Visit
Admission: RE | Admit: 2021-10-21 | Discharge: 2021-10-21 | Disposition: A | Discharge status: Home / Self Care | Payer: Medicare Other | Source: Ambulatory Visit | Attending: Urology | Admitting: Urology

## 2021-10-21 ENCOUNTER — Encounter: Payer: Self-pay | Admitting: Urology

## 2021-10-21 VITALS — BP 126/70 | HR 72 | Ht 69.0 in | Wt 161.0 lb

## 2021-10-21 DIAGNOSIS — N200 Calculus of kidney: Secondary | ICD-10-CM

## 2021-10-21 LAB — URINALYSIS, COMPLETE
Bilirubin, UA: NEGATIVE
Glucose, UA: NEGATIVE
Ketones, UA: NEGATIVE
Leukocytes,UA: NEGATIVE
Nitrite, UA: NEGATIVE
Protein,UA: NEGATIVE
RBC, UA: NEGATIVE
Specific Gravity, UA: 1.015 (ref 1.005–1.030)
Urobilinogen, Ur: 1 mg/dL (ref 0.2–1.0)
pH, UA: 6.5 (ref 5.0–7.5)

## 2021-10-21 LAB — MICROSCOPIC EXAMINATION

## 2021-10-21 NOTE — Patient Instructions (Signed)

## 2021-10-21 NOTE — Progress Notes (Unsigned)
10/21/2021 2:01 PM   Jaime Powell [birth year] [id number]  Referring provider: Tracie Harrier, MD [address] [facility] [place],  [place] [address]  Chief Complaint  Patient presents with   Nephrolithiasis    HPI: Jaime Powell is a 66 y.o. male referred for evaluation of nephrolithiasis.  His wife was present during the visit  Right upper quadrant ultrasound ordered by Dr. Ginette Pitman 09/22/2021 showed 2 nonobstructing renal calculi in the right kidney, the largest measuring 13 mm Asymptomatic and denies flank/abdominal pain Has a prior history of recurrent stone disease in 2000, 2003.  States he underwent ureteroscopic removal/laser lithotripsy of a 3.5 cm calculus Denies dysuria, gross hematuria   PMH: Past Medical History:  Diagnosis Date   degenerative disc disease    Hepatitis    Liver transplanted ([place]) 2022   Sepsis ([place]) 2019    Surgical History: Past Surgical History:  Procedure Laterality Date   LIVER TRANSPLANT     SHOULDER ARTHROSCOPY WITH OPEN ROTATOR CUFF REPAIR Left 07/07/2021   Procedure: Left shoulder arthroscopic vs mini open rotator cuff repair, biceps tenodesis, subacromial decompression;  Surgeon: Leim Fabry, MD;  Location: [facility] ORS;  Service: Orthopedics;  Laterality: Left;  Request RNFA    Home Medications:  Allergies as of 10/21/2021   No Known Allergies      Medication List        Accurate as of October 21, 2021  2:01 PM. If you have any questions, ask your nurse or doctor.          STOP taking these medications    mometasone 0.1 % lotion Commonly known as: ELOCON Stopped by: Abbie Sons, MD   triamcinolone ointment 0.1 % Commonly known as: KENALOG Stopped by: Abbie Sons, MD       TAKE these medications    amLODipine 10 MG tablet Commonly known as: NORVASC Take 10 mg by mouth daily.   CELLCEPT PO Take 500 mg by mouth 2 (two) times daily.   CITRACAL +D3 PO Take 2 tablets by mouth daily.    clobetasol ointment 0.05 % Commonly known as: TEMOVATE Apply topically 2 (two) times daily.   cycloSPORINE modified 100 MG capsule Commonly known as: GENGRAF Take 100 mg by mouth 2 (two) times daily.   GENGRAF PO Take 50 mg by mouth 2 (two) times daily.   Magnesium 400 MG Caps Take 1 tablet by mouth daily.   ondansetron 4 MG disintegrating tablet Commonly known as: ZOFRAN-ODT Take 1 tablet (4 mg total) by mouth every 8 (eight) hours as needed for nausea or vomiting.   oxyCODONE 5 MG immediate release tablet Commonly known as: Oxy IR/ROXICODONE Take 15 mg by mouth 2 (two) times daily as needed for severe pain.   oxyCODONE 5 MG immediate release tablet Commonly known as: Roxicodone Take 1-3 tablets (5-15 mg total) by mouth every 4 (four) hours as needed (pain).   QUEtiapine 25 MG tablet Commonly known as: SEROQUEL Take 25 mg by mouth at bedtime.   valGANciclovir 450 MG tablet Commonly known as: VALCYTE Take 900 mg by mouth 2 (two) times daily.        Allergies: No Known Allergies  Family History: No family history on file.  Social History:  reports that he quit smoking about 3 months ago. His smoking use included cigarettes. He has never used smokeless tobacco. He reports that he does not drink alcohol and does not use drugs.   Physical Exam: BP 126/70   Pulse  72   Ht '5\' 9"'$  (1.753 m)   Wt 161 lb (73 kg)   BMI 23.78 kg/m   Constitutional:  Alert and oriented, No acute distress. HEENT: [place] AT Respiratory: Normal respiratory effort, no increased work of breathing. Psychiatric: Normal mood and affect.  Laboratory Data:  Urinalysis Dipstick/microscopy negative  Pertinent Imaging: U/S images were personally reviewed and interpreted   Assessment & Plan:    1.  Right nephrolithiasis Management options were discussed including observation, ureteroscopy and SWL They inquired about the etiology of his stone disease and with history of recurrent stones I  recommended a metabolic evaluation which they desire to pursue At this point they would initially like surveillance and a KUB was ordered.  They will be notified with results If treatment is desired would recommend further evaluation with noncontrast CT of the abdomen and pelvis   Abbie Sons, MD  [facility] [address], [place], [place] [address] [phone]

## 2021-10-24 ENCOUNTER — Encounter: Payer: Self-pay | Admitting: Urology

## 2021-10-25 ENCOUNTER — Telehealth: Payer: Self-pay | Admitting: *Deleted

## 2021-10-25 NOTE — Telephone Encounter (Signed)
-----   Message from Abbie Sons, MD sent at 10/25/2021  2:11 PM EDT ----- Stones are easily seen on KUB.  No left-sided stones identified.  Will contact when [facility] results are completed.  If he desires surveillance would recommend a follow-up appointment with KUB in 3 months

## 2021-10-26 NOTE — Telephone Encounter (Signed)
Notified patient as instructed, patient pleased. Discussed follow-up appointments, patient agrees. Made 3 month kub appt.

## 2022-01-25 ENCOUNTER — Other Ambulatory Visit: Payer: Self-pay | Admitting: *Deleted

## 2022-01-25 DIAGNOSIS — N200 Calculus of kidney: Secondary | ICD-10-CM

## 2022-01-26 ENCOUNTER — Ambulatory Visit
Admission: RE | Admit: 2022-01-26 | Discharge: 2022-01-26 | Disposition: A | Discharge status: Home / Self Care | Payer: 59 | Source: Ambulatory Visit | Attending: Urology | Admitting: Urology

## 2022-01-26 ENCOUNTER — Ambulatory Visit: Payer: Self-pay | Admitting: General Surgery

## 2022-01-26 ENCOUNTER — Encounter: Payer: Self-pay | Admitting: Urology

## 2022-01-26 ENCOUNTER — Ambulatory Visit (INDEPENDENT_AMBULATORY_CARE_PROVIDER_SITE_OTHER): Payer: 59 | Admitting: Urology

## 2022-01-26 VITALS — BP 146/84 | HR 56 | Ht 69.0 in | Wt 161.0 lb

## 2022-01-26 DIAGNOSIS — N200 Calculus of kidney: Secondary | ICD-10-CM | POA: Diagnosis present

## 2022-01-26 NOTE — Progress Notes (Signed)
01/26/2022 3:06 PM   Jaime Powell 26-May-1955 [id number]  Referring provider: Tracie Harrier, MD [address] [facility] [place],  [place] [address]  Chief Complaint  Patient presents with   Nephrolithiasis    HPI: 67 y.o. male presents for follow-up of nephrolithiasis.  His wife was present during the visit  Right upper quadrant ultrasound ordered by Dr. Ginette Pitman 09/22/2021 showed 2 nonobstructing renal calculi in the right kidney, the largest measuring 13 mm Remains asymptomatic Has recently had pain secondary to a right inguinal hernia and he thought this was the purpose of his visit today. Dr. Ginette Pitman has placed a general surgery referral   PMH: Past Medical History:  Diagnosis Date   degenerative disc disease    Hepatitis    Liver transplanted ([place]) 2022   Sepsis ([place]) 2019    Surgical History: Past Surgical History:  Procedure Laterality Date   LIVER TRANSPLANT     SHOULDER ARTHROSCOPY WITH OPEN ROTATOR CUFF REPAIR Left 07/07/2021   Procedure: Left shoulder arthroscopic vs mini open rotator cuff repair, biceps tenodesis, subacromial decompression;  Surgeon: Leim Fabry, MD;  Location: [facility] ORS;  Service: Orthopedics;  Laterality: Left;  Request RNFA    Home Medications:  Allergies as of 01/26/2022       Reactions   Tacrolimus Other (See Comments)   Delirium        Medication List        Accurate as of January 26, 2022  3:06 PM. If you have any questions, ask your nurse or doctor.          STOP taking these medications    amLODipine 10 MG tablet Commonly known as: NORVASC Stopped by: Abbie Sons, MD   CELLCEPT PO Stopped by: Abbie Sons, MD   clobetasol ointment 0.05 % Commonly known as: TEMOVATE Stopped by: Abbie Sons, MD   Magnesium 400 MG Caps Stopped by: Abbie Sons, MD   ondansetron 4 MG disintegrating tablet Commonly known as: ZOFRAN-ODT Stopped by: Abbie Sons, MD   QUEtiapine 25 MG  tablet Commonly known as: SEROQUEL Stopped by: Abbie Sons, MD   valGANciclovir 450 MG tablet Commonly known as: VALCYTE Stopped by: Abbie Sons, MD       TAKE these medications    ALPRAZolam 0.25 MG tablet Commonly known as: XANAX 1-2 tabs po qhs   amLODipine-benazepril 5-20 MG capsule Commonly known as: LOTREL Take 1 capsule by mouth daily.   CITRACAL +D3 PO Take 2 tablets by mouth daily.   cycloSPORINE modified 100 MG capsule Commonly known as: GENGRAF Take 100 mg by mouth 2 (two) times daily.   GENGRAF PO Take 50 mg by mouth 2 (two) times daily.   oxyCODONE 5 MG immediate release tablet Commonly known as: Oxy IR/ROXICODONE Take 15 mg by mouth 2 (two) times daily as needed for severe pain.   oxyCODONE 5 MG immediate release tablet Commonly known as: Roxicodone Take 1-3 tablets (5-15 mg total) by mouth every 4 (four) hours as needed (pain).        Allergies:  Allergies  Allergen Reactions   Tacrolimus Other (See Comments)    Delirium    Family History: No family history on file.  Social History:  reports that he quit smoking about 7 months ago. His smoking use included cigarettes. He has never used smokeless tobacco. He reports that he does not drink alcohol and does not use drugs.   Physical Exam: BP (!) 146/84  Pulse (!) 56   Ht '5\' 9"'$  (1.753 m)   Wt 161 lb (73 kg)   BMI 23.78 kg/m   Constitutional:  Alert and oriented, No acute distress. HEENT: [place] AT Respiratory: Normal respiratory effort, no increased work of breathing. Psychiatric: Normal mood and affect.  Pertinent Imaging: KUB images were personally reviewed and interpreted.  Stable right renal calculi   Assessment & Plan:    1.  Right nephrolithiasis Stable; patient asymptomatic He elected not to perform metabolic stone evaluation Follow-up 1 year with [place], MD  [facility] [address] [place], [place]  [address] [phone]

## 2022-01-26 NOTE — H&P (View-Only) (Signed)
PATIENT PROFILE: Jaime Powell is a 67 y.o. male who presents to the Clinic for consultation at the request of Dr. Ginette Pitman for evaluation of right hand hernia.  PCP:  Azzie Glatter, MD  HISTORY OF PRESENT ILLNESS: [name] reports having right annual hernia for the last few months.  He endorses that he feels a bulge in the right groin.  This is tender when he does certain activities.  Pain is also aggravated by coughing.  This is getting more symptomatic with time.  Alleviating factor is resting and reducing the hernia.  No radiation.  He denies any episode of abdominal distention, nausea or vomiting.  There is some tenderness on the left groin as well.   PROBLEM LIST: Problem List  Date Reviewed: 11/17/2021          Noted   Hypomagnesemia 10/25/2020   At risk for hyperglycemia 09/30/2020   Immunosuppressed status (CMS-HCC) 09/29/2020   Liver transplanted (CMS-HCC) 09/29/2020   Overview    Date of Transplant = 09/29/20  Donor ABO: A Recipient ABO: A Positive Donor/Recipient ABOs are: compatible  Donor Type: Donation after Brain Death Allograft Type: Whole Liver Anomalous allograft anatomy:  Extra vessels used:  none  Biliary Stent: None Donor Liver Bx: No data recorded unclear Induction agent: solumedrol Cold ischemia time: 409 minutes Warm ischemia time:  38 minutes Allograft injury/complications: none                                                   CMV Status: CMV Donor = Positive   CMV Recipient =  Lab Results  Component Value Date   CMVIGG Negative 02/10/2020  EBV Status: Donor EBV IgG = Positive  Recipient  Lab Results  Component Value Date   VCAIGG Positive (!) 02/10/2020    Lab Results  Component Value Date   VCAIGM Negative 02/10/2020        No components found for: EBAN  Lab Results  Component Value Date   EBVEAIGG Negative 02/10/2020  Donor HIV, HBV, HCV criteria? 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 This is indicative of prior Syphilis infection, but cannot confirm treated vs untreated. If we had documentation of prior treatment, could defer further management unless there was concern for re-exposure. However, we have no clear records of prior treatment, and pt does not recall this diagnosis previously. Thus, will provide treatment today. No clinical evidence of neurosyphilis on exam. No active symptoms of acute infection. Will treat as syphilis of unknown duration/late latent syphilis with 3 doses of IM penicillin      Thrombocytopenia (CMS-HCC) 02/17/2020   Overview    Results for MCKAI, DEATS (MRN [id number]) as of 02/17/2020 16:00  Ref. Range 02/10/2020 10:48  Platelet Count /L Latest Ref Range: 150 - 450 x10^9/L 42 (L)  No acute bleeding episodes, will likely need transfusion of platelets for any invasive procedures/surgeries.      Leukopenia 02/17/2020   Red blood cell antibody positive, compatible PRBC difficult to obtain 02/11/2020   Overview    Anti-M Because of the presence of atypical antibodies, please allow 4 hours for the Transfusion Service to find compatible blood for this patient.      Psoriasis 11/11/2019  Hepatocellular carcinoma (CMS-HCC) (Chronic) 10/21/2019   Acute pain of right shoulder 02/06/2019   History of substance use 02/06/2019   History of alcohol use 02/06/2019   Chronic pain 11/23/2012   Overview    Last Assessment & Plan:  Formatting of this note might be different from the original. - Referral to pain management. Feel this is a significant contributor to his depression. Given history do not feel he would be appropriate for opiates from our clinic.  - Increase gabapentin 300 mg TID.  - Continue diclofenac 75 mg BID prn.      Stiffness of finger joint 06/11/2012   Overview    Last Assessment & Plan:  Formatting of this note might be different from the original. --referred for PT today at [facility] --discontinued Oxycodone 35m prn --Prescribed refills of Diclofenac 762m Gabapentin 10058mTramadol 65m29mn      Chronic hepatitis C without hepatic coma (CMS-HCC) 06/04/2012   Overview    Dx in 1990s (?). History of IV/IN cocaine in 1960s-1970s, abstinent since 1980s. History of alcohol abuse in teens and 20s,62sw 1-2 beers/week.  No tattoos or  Transfusions. Smokes MJ daily. GT: 03/19/18 - 1a. Initial RNA: 02/11/19 - [id number] B: 02/12/19 - HBc total ab reactive, HBs Ab reactive --> immune from prior exposure. Hep A: None. Declines testing today. HIV: 02/07/19 - nonreactive. Imaging: 07/11/19 RUQ US -Koreaild surface nodularity suspicious for cirrhosis. Fibroscan: 07/10/19 - HCV Fibrosure c/w F4. Tx: Pending.       GENERAL REVIEW OF SYSTEMS:   General ROS: negative for - chills, fatigue, fever, weight gain or weight loss Allergy and Immunology ROS: negative for - hives  Hematological and Lymphatic ROS: negative for - bleeding problems or bruising, negative for palpable nodes Endocrine ROS: negative for - heat or cold intolerance, hair changes Respiratory ROS: negative for - cough, shortness of breath or wheezing Cardiovascular ROS: no chest pain or palpitations GI ROS: negative for nausea, vomiting, abdominal pain, diarrhea, constipation Musculoskeletal ROS: negative for - joint swelling or muscle pain Neurological ROS: negative for - confusion, syncope Dermatological ROS: negative for pruritus and rash Psychiatric: negative for anxiety, depression, difficulty sleeping and memory loss  MEDICATIONS: Current Outpatient Medications  Medication Sig Dispense Refill   ALPRAZolam (XANAX) 0.25 MG tablet 1-2 tabs po qhs 60 tablet 1   amLODIPine-benazepril (LOTREL) 5-20 mg capsule Take 1 capsule by mouth once daily 90 capsule 1   cyanocobalamin (VITAMIN B12) 500 MCG tablet Take 1 tablet (500 mcg total) by mouth once daily 30 tablet 5   cycloSPORINE modified (GENGRAF) 25 MG capsule Take one  100mg42msule with one 25mg 87mules (for 125mg t9m) twice a day     GENGRAF 100 mg capsule Take one 100mg ca56me with one 25mg cap41ms (for 125 mg total) twice a day     mycophenolate (CELLCEPT) 250 mg capsule Take 2 capsules (500 mg total) by mouth every 12 (twelve) hours 360 capsule 3   oxyCODONE (ROXICODONE) 15 MG immediate release tablet Take 1 tablet (15 mg total) by mouth 2 (two) times daily as needed for Pain for up to 30 days 60 tablet 0   No current facility-administered medications for this visit.    ALLERGIES: Tacrolimus  PAST MEDICAL HISTORY: Past Medical History:  Diagnosis Date   Bacteremia 04/2019   Hepatitis C    History of alcohol use 02/06/2019   History of cancer Liver cancer   Leukocytoclastic vasculitis (CMS-HCC)    Psoriasis  PAST SURGICAL HISTORY: Past Surgical History:  Procedure Laterality Date   ESOPHAGOGASTRODOUDENOSCOPY W/BAND LIGATION VARICIES N/A 04/23/2020   Procedure: EGD - Upper Endoscopy;  Surgeon: Lillia Corporal, MD;  Location: [facility];  Service: Gastroenterology;  Laterality: N/A;   COLONOSCOPY N/A 04/23/2020   Procedure: Colonoscopy;  Surgeon: Lillia Corporal, MD;  Location: [facility];  Service: Gastroenterology;  Laterality: N/A;   EXTRACTION TEETH N/A 06/18/2020   Procedure: EXTRACTION TEETH x 23 (1, 4, 5, 6, 7, 8, 9, 10, 11, 12, 13, 19, 20, 21, 22, 23, 24, 25, 26, 27, 28, 29, 30);  Surgeon: Lynden Oxford, Sheppard Evens, DMD;  Location: [place];  Service: Plastic Surgery;  Laterality: N/A;   REPAIR TOOTH SOCKET N/A 06/18/2020   Procedure: ALVEOLOPLASTY, EACH QUADRANT (UR, UL, LR) Also LR tori removal and UR buccal exostosis removal.;  Surgeon: Lynden Oxford, Sheppard Evens, DMD;  Location: [place];  Service: Plastic Surgery;  Laterality: N/A;   TRANSPLANT LIVER N/A 09/28/2020   Procedure: ADULT, LIVER ALLOTRANSPLANTATION; ORTHOTOPIC, PARTIAL OR WHOLE, FROM CADAVER OR LIVING  DONOR;  Surgeon: Conni Elliot, MD;  Location: [place];  Service: General Surgery;  Laterality: N/A;   BRONCHOSCOPY Bilateral 10/12/2020   Procedure: BRONCHOSCOPY, RIGID OR FLEXIBLE, INCLUDING FLUOROSCOPIC GUIDANCE, WHEN PERFORMED; WITH BRONCHIAL ALVEOLAR LAVAGE;  Surgeon: Adrian Blackwater, MD;  Location[facility];  Service: Pulmonary;  Laterality: Bilateral;   BRONCHOSCOPY W/TRANSBRONCHIAL BIOPSY FLEXIBLE Bilateral 10/12/2020   Procedure: BRONCHOSCOPY, FLEXIBLE, INCLUDING FLUOROSCOPIC GUIDANCE, WHEN PERFORMED; WITH TRANSBRONCHIAL LUNG BIOPSY(S), SINGLE LOBE;  Surgeon: Adrian Blackwater, MD;  Location: [facility];  Service: Pulmonary;  Laterality: Bilateral;   BRONCHOSCOPY Bilateral 10/12/2020   Procedure: BRONCHOSCOPY, RIGID OR FLEXIBLE, INCL FLUORO GUIDE; W/ EBUS DURING [facility] DIAGNOSTIC/THERAPEUTIC INTERVENTION FOR PERIPHERAL LESION (LIST SEPARATELY IN ADDITION TO CODE FOR PRIMARY PROCEDURE);  Surgeon: Adrian Blackwater, MD;  [facility];  Service: Pulmonary;  Laterality: Bilateral;   Lt mini-open rotator cuff repair, open biceps tenodesis, extensive debridement of shoulder (glenohumeral & subacromial spaces) subacromial decompression Left 07/07/2021   Dr. Posey Pronto   EYE SURGERY     finger surgery       FAMILY HISTORY: Family History  Problem Relation Age of Onset   High blood pressure (Hypertension) Mother    Diabetes type II Mother    Asthma Mother    Anesthesia problems Neg Hx    Malignant hyperthermia Neg Hx      SOCIAL HISTORY: Social History   Socioeconomic History   Marital status: Married    Spouse name: Kennth Demory   Number of children: 2   Years of education: 11   Highest education level: GED or equivalent  Occupational History   Occupation: Retired  Tobacco Use   Smoking status: Some Days    Packs/day: 0.00    Years: 0.00    Additional pack years: 0.00    Total pack years: 0.00    Types: Cigarettes   Smokeless tobacco:  Never   Tobacco comments:    Occasionally daily  Vaping Use   Vaping Use: Never used  Substance and Sexual Activity   Alcohol use: Not Currently    Alcohol/week: 0.0 standard drinks of alcohol    Comment: Haven't drank alcohol since October 2021   Drug use: Not Currently   Sexual activity: Defer    Partners: Female    Birth control/protection: None   Social Determinants of Health   Transportation Needs: No Transportation Needs (09/29/2020)  PRAPARE - Hydrologist (Medical): No    Lack of Transportation (Non-Medical): No    PHYSICAL EXAM: Vitals:   01/26/22 1533  BP: (!) 150/76  Pulse: 57   Body mass index is 23.1 kg/m. Weight: 73 kg (161 lb)   GENERAL: Alert, active, oriented x3  HEENT: Pupils equal reactive to light. Extraocular movements are intact. Sclera clear. Palpebral conjunctiva normal red color.Pharynx clear.  NECK: Supple with no palpable mass and no adenopathy.  LUNGS: Sound clear with no rales rhonchi or wheezes.  HEART: Regular rhythm S1 and S2 without murmur.  ABDOMEN: Soft and depressible, nontender with no palpable mass, no hepatomegaly.  Right inguinal hernia, reducible.  Tenderness on the left groin.  EXTREMITIES: Well-developed well-nourished symmetrical with no dependent edema.  NEUROLOGICAL: Awake alert oriented, facial expression symmetrical, moving all extremities.  REVIEW OF DATA: I have reviewed the following data today: Orders Only on 01/17/2022  Component Date Value   White Blood Cell Count -* 01/17/2022 4.7    Red Blood Cell Count - L* 01/17/2022 3.93 (L)    Hemoglobin - [facility] 01/17/2022 11.3 (L)    Hematocrit - [facility] 01/17/2022 34.3 (L)    MCV - [facility] 01/17/2022 87    MCH  - [facility] 01/17/2022 28.8    MCHC - [facility] 01/17/2022 32.9    RDW - [facility] 01/17/2022 12.1    Platelets - [facility] 01/17/2022 110 (L)    Neutrophils - [facility] 01/17/2022 62    LYMPHS -  [facility] 01/17/2022 29    Monocytes -  [facility] 01/17/2022 6    Eos - [facility] 01/17/2022 3    Basos - [facility] 01/17/2022 0    Neutrophils (Absolute) -* 01/17/2022 2.9    Lymphs (Absolute) - [facility] 01/17/2022 1.4    Monocytes(Absolute) - [facility]* 01/17/2022 0.3    Eos (Absolute) - [facility] 01/17/2022 0.1    Baso (Absolute) - [facility] 01/17/2022 0.0    Immature Granulocytes - * 01/17/2022 0    Immature Grans (Abs) - L* 01/17/2022 0.0    Glucose Random - [facility] 01/17/2022 104 (H)    Blood Urea Nitrogen - [facility]* 01/17/2022 20    Creatinine  - [facility] 01/17/2022 1.49 (H)    EGFR (CKD-EPI 2021) - [facility]* 01/17/2022 51 (L)    Bun/Creatinine Ratio - L* 01/17/2022 13    Sodium - [facility] 01/17/2022 140    Potassium - [facility] 01/17/2022 5.0    Chloride - [facility] 01/17/2022 106    Carbon Dioxide - [facility] 01/17/2022 23    Calcium - [facility] 01/17/2022 9.4    Protein Total - [facility] 01/17/2022 7.0    Albumin - [facility] 01/17/2022 4.2    Globulin, Total - [facility] 01/17/2022 2.8    A/G Ratio - [facility] 01/17/2022 1.5    Bilirubin Total - [facility] 01/17/2022 0.8    Alkaline Phosphatase - L* 01/17/2022 118    AST (SGOT) - [facility] 01/17/2022 23    ALT (SGPT) - [facility] 01/17/2022 11    Magnesium - [facility] 01/17/2022 2.3    Cyclosporine by [facility]* 01/17/2022 210    CMV Quant DNA PCR (Plasm* 01/17/2022 Negative    log10 CMV Qn DNA Pl - La* 01/17/2022 CANCELED   Orders Only on 12/06/2021  Component Date Value   White Blood Cell Count -* 12/06/2021 4.1    Red Blood Cell Count - L* 12/06/2021 3.49 (L)    Hemoglobin - [facility] 12/06/2021 10.2 (L)    Hematocrit - [facility] 12/06/2021 30.1 (L)  MCV - [facility] 12/06/2021 86    MCH  - [facility] 12/06/2021 29.2    MCHC - [facility] 12/06/2021 33.9    RDW - [facility] 12/06/2021 12.1    Platelets - [facility] 12/06/2021 98 (<)    Neutrophils - [facility] 12/06/2021 71    LYMPHS -  [facility] 12/06/2021 18    Monocytes - [facility] 12/06/2021 7    Eos - [facility] 12/06/2021 3    Basos - [facility] 12/06/2021 1    Neutrophils  (Absolute) -* 12/06/2021 2.9    Lymphs (Absolute) - [facility] 12/06/2021 0.7    Monocytes(Absolute) - [facility]* 12/06/2021 0.3    Eos (Absolute) - [facility] 12/06/2021 0.1    Baso (Absolute) - [facility] 12/06/2021 0.0    Immature Granulocytes - * 12/06/2021 0    Immature Grans (Abs) - L* 12/06/2021 0.0    Hematology Comments: - L* 12/06/2021 Note:    Glucose Random - [facility] 12/06/2021 114 (H)    Blood Urea Nitrogen - [facility]* 12/06/2021 20    Creatinine  - [facility] 12/06/2021 1.39 (H)    EGFR (CKD-EPI 2021) - [facility]* 12/06/2021 56 (L)    Bun/Creatinine Ratio - L* 12/06/2021 14    Sodium - [facility] 12/06/2021 143    Potassium - [facility] 12/06/2021 4.7    Chloride - [facility] 12/06/2021 106    Carbon Dioxide - [facility] 12/06/2021 23    Calcium - [facility] 12/06/2021 9.8    Protein Total - [facility] 12/06/2021 7.1    Albumin - [facility] 12/06/2021 4.5    Globulin, Total - [facility] 12/06/2021 2.6    A/G Ratio - [facility] 12/06/2021 1.7    Bilirubin Total - [facility] 12/06/2021 0.8    Alkaline Phosphatase - L* 12/06/2021 134 (H)    AST (SGOT) - [facility] 12/06/2021 24    ALT (SGPT) - [facility] 12/06/2021 16    Magnesium - [facility] 12/06/2021 1.9    Cyclosporine by [facility]* 12/06/2021 130    CMV Quant DNA PCR (Plasm* 12/06/2021 Negative    log10 CMV Qn DNA Pl - La* 12/06/2021 [facility] Outpatient Visit on 11/17/2021  Component Date Value   POC Creatinine 11/17/2021 1.4 (H)   Office Visit on 11/17/2021  Component Date Value   HBV DNA Detect/Quant, S * 11/17/2021 Undetected    Hepatitis C RNA-PCR, Qua* 11/17/2021 Not Detected    Alpha Fetoprotein (AFP) 11/17/2021 2.0    WBC (White Blood Cell Co* 11/17/2021 4.3    Hemoglobin 11/17/2021 11.7 (L)    Hematocrit 11/17/2021 37.6 (L)    Platelets 11/17/2021 108 (L)    MCV (Mean Corpuscular Vo* 11/17/2021 91    MCH (Mean Corpuscular He* 11/17/2021 28.4    MCHC (Mean Corpuscular H* 11/17/2021 31.1 (L)    RBC (Red Blood Cell Coun* 11/17/2021 4.12 (L)    RDW-CV (Red Cell  Distrib* 11/17/2021 11.9    NRBC (Nucleated Red Bloo* 11/17/2021 0.00    NRBC % (Nucleated Red Bl* 11/17/2021 0.0    MPV (Mean Platelet Volum* 11/17/2021 11.2    Sodium 11/17/2021 139    Potassium 11/17/2021 5.0    Chloride 11/17/2021 110 (H)    Carbon Dioxide (CO2) 11/17/2021 22    Urea Nitrogen (BUN) 11/17/2021 20    Creatinine 11/17/2021 1.6 (H)    Glucose 11/17/2021 99    Calcium 11/17/2021 9.9    AST (Aspartate Aminotran* 11/17/2021 26    ALT (Alanine Aminotransf* 11/17/2021 15    Bilirubin, Total 11/17/2021 1.5    Alk Phos (Alkaline Phosp*  11/17/2021 99    Albumin 11/17/2021 4.4    Protein, Total 11/17/2021 7.6    Anion Gap 11/17/2021 7    BUN/CREA Ratio 11/17/2021 13    Glomerular Filtration Ra* 11/17/2021 47    Cyclosporine A (CSA) 11/17/2021 611 (H)    Magnesium 11/17/2021 2.0   Order Transcription on 11/14/2021  Component Date Value   White Blood Cell Count E* 11/10/2021 3.7    Red Blood Cell Count Ext* 11/10/2021 3.74 (L)    Hemoglobin External 11/10/2021 10.8 (L)    Hematocrit External 11/10/2021 32.7 (L)    Mean Cell Volume External 11/10/2021 87    Mean Corpuscular Hemoglo* 11/10/2021 28.9    Mean Corpuscular Hemoglo* 11/10/2021 33.0    RDW External 11/10/2021 11.8    Platelet Count External 11/10/2021 97 (L)    Neutrophils External 11/10/2021 69    Lymphocytes External 11/10/2021 21    Monocytes External 11/10/2021 6    Eosinophils External 11/10/2021 3    Basophils External 11/10/2021 1    Absolute Neutrophils Ext* 11/10/2021 2.5    Absolute Lymphocytes Ext* 11/10/2021 0.8    Absolute Monocytes Exter* 11/10/2021 0.2    Absolute Eosinophils Ext* 11/10/2021 0.1    Absolute Basophils Exter* 11/10/2021 0.0    Glucose External 11/10/2021 106 (H)    Urea Nitrogen  External 11/10/2021 21    Creatinine External 11/10/2021 1.52 (H)    Glomerular Filtration Ra* 11/10/2021 50 (L)    BUN/Creatinine Ratio Ext* 11/10/2021 14    Sodium External 11/10/2021 141     Potassium External 11/10/2021 5.1    Chloride External 11/10/2021 107 (H)    Carbon Dioxide External 11/10/2021 21    Calcium External 11/10/2021 9.9    Total Protein External 11/10/2021 7.5    Albumin External 11/10/2021 4.7    Total Bilirubin External 11/10/2021 1.3 (H)    Alkaline Phosphatase Ext* 11/10/2021 115    Aspartate Aminotransfera* 11/10/2021 19    Alanine Aminotransferase* 11/10/2021 11    CMV Quant DNA PCR Extern* 11/10/2021 Negative    Cyclosporine  External 11/10/2021 194    Magnesium External 11/10/2021 2.0   Orders Only on 11/10/2021  Component Date Value   Glucose Random - [facility] 11/10/2021 106 (H)    Blood Urea Nitrogen - [facility]* 11/10/2021 21    Creatinine  - [facility] 11/10/2021 1.52 (H)    EGFR (CKD-EPI 2021) - [facility]* 11/10/2021 50 (L)    Bun/Creatinine Ratio - L* 11/10/2021 14    Sodium - [facility] 11/10/2021 141    Potassium - [facility] 11/10/2021 5.1    Chloride - [facility] 11/10/2021 107 (H)    Carbon Dioxide - [facility] 11/10/2021 21    Calcium - [facility] 11/10/2021 9.9    Protein Total - [facility] 11/10/2021 7.5    Albumin - [facility] 11/10/2021 4.7    Globulin, Total - [facility] 11/10/2021 2.8    A/G Ratio - [facility] 11/10/2021 1.7    Bilirubin Total - [facility] 11/10/2021 1.3 (H)    Alkaline Phosphatase - L* 11/10/2021 115    AST (SGOT) - [facility] 11/10/2021 19    ALT (SGPT) - [facility] 11/10/2021 11    Magnesium - [facility] 11/10/2021 2.0    White Blood Cell Count -* 11/10/2021 3.7    Red Blood Cell Count - L* 11/10/2021 3.74 (L)    Hemoglobin - [facility] 11/10/2021 10.8 (L)    Hematocrit - [facility] 11/10/2021 32.7 (L)    MCV - [facility] 11/10/2021 87    MCH  -  [facility] 11/10/2021 28.9    MCHC - [facility] 11/10/2021 33.0    RDW - [facility] 11/10/2021 11.8    Platelets - [facility] 11/10/2021 97 (<)    Neutrophils - [facility] 11/10/2021 69    LYMPHS -  [facility] 11/10/2021 21    Monocytes - [facility] 11/10/2021 6    Eos - [facility] 11/10/2021 3    Basos - [facility] 11/10/2021 1     Neutrophils (Absolute) -* 11/10/2021 2.5    Lymphs (Absolute) - [facility] 11/10/2021 0.8    Monocytes(Absolute) - [facility]* 11/10/2021 0.2    Eos (Absolute) - [facility] 11/10/2021 0.1    Baso (Absolute) - [facility] 11/10/2021 0.0    Immature Granulocytes - * 11/10/2021 0    Immature Grans (Abs) - L* 11/10/2021 0.0    Hematology Comments: - L* 11/10/2021 Note:    Cyclosporine by [facility]* 11/10/2021 194    CMV Quant DNA PCR (Plasm* 11/10/2021 Negative    log10 CMV Qn DNA Pl - [facility]* 11/10/2021 CANCELED   Orders Only on 10/28/2021  Component Date Value   White Blood Cell Count -* 10/28/2021 3.5    Red Blood Cell Count - L* 10/28/2021 3.68 (L)    Hemoglobin - [facility] 10/28/2021 10.6 (L)    Hematocrit - [facility] 10/28/2021 32.9 (L)    MCV - [facility] 10/28/2021 89    MCH  - [facility] 10/28/2021 28.8    MCHC - [facility] 10/28/2021 32.2    RDW - [facility] 10/28/2021 11.9    Platelets - [facility] 10/28/2021 97 (<)    Neutrophils - [facility] 10/28/2021 68    LYMPHS -  [facility] 10/28/2021 25    Monocytes - [facility] 10/28/2021 5    Eos - [facility] 10/28/2021 2    Basos - [facility] 10/28/2021 0    Neutrophils (Absolute) -* 10/28/2021 2.4    Lymphs (Absolute) - [facility] 10/28/2021 0.9    Monocytes(Absolute) - [facility]* 10/28/2021 0.2    Eos (Absolute) - [facility] 10/28/2021 0.1    Baso (Absolute) - [facility] 10/28/2021 0.0    Immature Granulocytes - * 10/28/2021 0    Immature Grans (Abs) - L* 10/28/2021 0.0    Hematology Comments: - L* 10/28/2021 Note:    Glucose Random - [facility] 10/28/2021 103 (H)    Blood Urea Nitrogen - [facility]* 10/28/2021 23    Creatinine  - [facility] 10/28/2021 1.55 (H)    EGFR (CKD-EPI 2021) - [facility]* 10/28/2021 49 (L)    Bun/Creatinine Ratio - L* 10/28/2021 15    Sodium - [facility] 10/28/2021 140    Potassium - [facility] 10/28/2021 5.4 (H)    Chloride - [facility] 10/28/2021 106    Carbon Dioxide - [facility] 10/28/2021 21    Calcium - [facility] 10/28/2021 9.9    Protein Total - [facility] 10/28/2021 7.5    Albumin - [facility]  10/28/2021 4.7    Globulin, Total - [facility] 10/28/2021 2.8    A/G Ratio - [facility] 10/28/2021 1.7    Bilirubin Total - [facility] 10/28/2021 0.9    Alkaline Phosphatase - L* 10/28/2021 115    AST (SGOT) - [facility] 10/28/2021 21    ALT (SGPT) - [facility] 10/28/2021 14    CMV Quant DNA PCR (Plasm* 10/28/2021 Positive < 200    log10 CMV Qn DNA Pl - [facility]* 10/28/2021 CANCELED    Cyclosporine by [facility]* 10/28/2021 138    Magnesium - [facility] 10/28/2021 2.0      ASSESSMENT: Mr. Drechsler is a 67 y.o. male presenting for consultation for right versus bilateral inguinal hernia.  The patient presents with a symptomatic, reducible inguinal hernia. Patient was oriented about the diagnosis of inguinal hernia and its implication. The patient was oriented about the treatment alternatives (observation vs surgical repair). Due to patient symptoms, repair is recommended. Patient oriented about the surgical procedure, the use of mesh and its risk of complications such as: infection, bleeding, injury to vas deference, vasculature and testicle, injury to bowel or bladder, and chronic pain.   Patient is s/p liver transplant.  He is very stable.  Labs are within normal limits.  Patient is very active, no chest pain, no shortness of breath.  Patient can achieve 4 METS without chest pain or shortness of breath.  He understand the possibility of needing open hernia repair if adhesions does not let me get to the right plain.  Non-recurrent unilateral inguinal hernia without obstruction or gangrene [K40.90]  PLAN: 1.  Robotic assisted laparoscopic right vs bilateral inguinal hernia repair with mesh [id number]) 2.  CBC, CMP (done) 3.  Contact us if has any question or concern.   Patient and is wife verbalized understanding, all questions were answered, and were agreeable with the plan outlined above.

## 2022-01-26 NOTE — H&P (Signed)
PATIENT PROFILE: Jaime Powell is a 67 y.o. male who presents to the Clinic for consultation at the request of Dr. Hande for evaluation of right hand hernia.  PCP:  Hande, Vishwanath Handattur, MD  HISTORY OF PRESENT ILLNESS: Jaime Powell reports having right annual hernia for the last few months.  He endorses that he feels a bulge in the right groin.  This is tender when he does certain activities.  Pain is also aggravated by coughing.  This is getting more symptomatic with time.  Alleviating factor is resting and reducing the hernia.  No radiation.  He denies any episode of abdominal distention, nausea or vomiting.  There is some tenderness on the left groin as well.   PROBLEM LIST: Problem List  Date Reviewed: 11/17/2021          Noted   Hypomagnesemia 10/25/2020   At risk for hyperglycemia 09/30/2020   Immunosuppressed status (CMS-HCC) 09/29/2020   Liver transplanted (CMS-HCC) 09/29/2020   Overview    Date of Transplant = 09/29/20  Donor ABO: A Recipient ABO: A Positive Donor/Recipient ABOs are: compatible  Donor Type: Donation after Brain Death Allograft Type: Whole Liver Anomalous allograft anatomy:  Extra vessels used:  none  Biliary Stent: None Donor Liver Bx: No data recorded unclear Induction agent: solumedrol Cold ischemia time: 409 minutes Warm ischemia time:  38 minutes Allograft injury/complications: none                                                   CMV Status: CMV Donor = Positive   CMV Recipient =  Lab Results  Component Value Date   CMVIGG Negative 02/10/2020  EBV Status: Donor EBV IgG = Positive  Recipient  Lab Results  Component Value Date   VCAIGG Positive (!) 02/10/2020    Lab Results  Component Value Date   VCAIGM Negative 02/10/2020        No components found for: EBAN  Lab Results  Component Value Date   EBVEAIGG Negative 02/10/2020  Donor HIV, HBV, HCV criteria? 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 This is indicative of prior Syphilis infection, but cannot confirm treated vs untreated. If we had documentation of prior treatment, could defer further management unless there was concern for re-exposure. However, we have no clear records of prior treatment, and pt does not recall this diagnosis previously. Thus, will provide treatment today. No clinical evidence of neurosyphilis on exam. No active symptoms of acute infection. Will treat as syphilis of unknown duration/late latent syphilis with 3 doses of IM penicillin      Thrombocytopenia (CMS-HCC) 02/17/2020   Overview    Results for Crain, Davion (MRN [id number]) as of 02/17/2020 16:00  Ref. Range 02/10/2020 10:48  Platelet Count /L Latest Ref Range: 150 - 450 x10^9/L 42 (L)  No acute bleeding episodes, will likely need transfusion of platelets for any invasive procedures/surgeries.      Leukopenia 02/17/2020   Red blood cell antibody positive, compatible PRBC difficult to obtain 02/11/2020   Overview    Anti-M Because of the presence of atypical antibodies, please allow 4 hours for the Transfusion Service to find compatible blood for this patient.      Psoriasis 11/11/2019     Hepatocellular carcinoma (CMS-HCC) (Chronic) 10/21/2019   Acute pain of right shoulder 02/06/2019   History of substance use 02/06/2019   History of alcohol use 02/06/2019   Chronic pain 11/23/2012   Overview    Last Assessment & Plan:  Formatting of this note might be different from the original. - Referral to pain management. Feel this is a significant contributor to his depression. Given history do not feel he would be appropriate for opiates from our clinic.  - Increase gabapentin 300 mg TID.  - Continue diclofenac 75 mg BID prn.      Stiffness of finger joint 06/11/2012   Overview    Last Assessment & Plan:  Formatting of this note might be different from the original. --referred for PT today at [facility] --discontinued Oxycodone 15mg prn --Prescribed refills of Diclofenac 75mg, Gabapentin 100mg, Tramadol 50mg prn      Chronic hepatitis C without hepatic coma (CMS-HCC) 06/04/2012   Overview    Dx in 1990s (?). History of IV/IN cocaine in 1960s-1970s, abstinent since 1980s. History of alcohol abuse in teens and 20s, now 1-2 beers/week.  No tattoos or  Transfusions. Smokes MJ daily. GT: 03/19/18 - 1a. Initial RNA: 02/11/19 - 538,124. Hep B: 02/12/19 - HBc total ab reactive, HBs Ab reactive --> immune from prior exposure. Hep A: None. Declines testing today. HIV: 02/07/19 - nonreactive. Imaging: 07/11/19 RUQ US - Mild surface nodularity suspicious for cirrhosis. Fibroscan: 07/10/19 - HCV Fibrosure c/w F4. Tx: Pending.       GENERAL REVIEW OF SYSTEMS:   General ROS: negative for - chills, fatigue, fever, weight gain or weight loss Allergy and Immunology ROS: negative for - hives  Hematological and Lymphatic ROS: negative for - bleeding problems or bruising, negative for palpable nodes Endocrine ROS: negative for - heat or cold intolerance, hair changes Respiratory ROS: negative for - cough, shortness of breath or wheezing Cardiovascular ROS: no chest pain or palpitations GI ROS: negative for nausea, vomiting, abdominal pain, diarrhea, constipation Musculoskeletal ROS: negative for - joint swelling or muscle pain Neurological ROS: negative for - confusion, syncope Dermatological ROS: negative for pruritus and rash Psychiatric: negative for anxiety, depression, difficulty sleeping and memory loss  MEDICATIONS: Current Outpatient Medications  Medication Sig Dispense Refill   ALPRAZolam (XANAX) 0.25 MG tablet 1-2 tabs po qhs 60 tablet 1   amLODIPine-benazepril (LOTREL) 5-20 mg capsule Take 1 capsule by mouth once daily 90 capsule 1   cyanocobalamin (VITAMIN B12) 500 MCG tablet Take 1 tablet (500 mcg total) by mouth once daily 30 tablet 5   cycloSPORINE modified (GENGRAF) 25 MG capsule Take one  100mg capsule with one 25mg capsules (for 125mg total) twice a day     GENGRAF 100 mg capsule Take one 100mg capsule with one 25mg capsules (for 125 mg total) twice a day     mycophenolate (CELLCEPT) 250 mg capsule Take 2 capsules (500 mg total) by mouth every 12 (twelve) hours 360 capsule 3   oxyCODONE (ROXICODONE) 15 MG immediate release tablet Take 1 tablet (15 mg total) by mouth 2 (two) times daily as needed for Pain for up to 30 days 60 tablet 0   No current facility-administered medications for this visit.    ALLERGIES: Tacrolimus  PAST MEDICAL HISTORY: Past Medical History:  Diagnosis Date   Bacteremia 04/2019   Hepatitis C    History of alcohol use 02/06/2019   History of cancer Liver cancer   Leukocytoclastic vasculitis (CMS-HCC)    Psoriasis       PAST SURGICAL HISTORY: Past Surgical History:  Procedure Laterality Date   ESOPHAGOGASTRODOUDENOSCOPY W/BAND LIGATION VARICIES N/A 04/23/2020   Procedure: EGD - Upper Endoscopy;  Surgeon: Segovia, Maria Cristina, MD;  Location: [facility];  Service: Gastroenterology;  Laterality: N/A;   COLONOSCOPY N/A 04/23/2020   Procedure: Colonoscopy;  Surgeon: Segovia, Maria Cristina, MD;  Location: [facility];  Service: Gastroenterology;  Laterality: N/A;   EXTRACTION TEETH N/A 06/18/2020   Procedure: EXTRACTION TEETH x 23 (1, 4, 5, 6, 7, 8, 9, 10, 11, 12, 13, 19, 20, 21, 22, 23, 24, 25, 26, 27, 28, 29, 30);  Surgeon: Wilson Westmark, Nancy Adalyn, DMD;  Location: [facility];  Service: Plastic Surgery;  Laterality: N/A;   REPAIR TOOTH SOCKET N/A 06/18/2020   Procedure: ALVEOLOPLASTY, EACH QUADRANT (UR, UL, LR) Also LR tori removal and UR buccal exostosis removal.;  Surgeon: Wilson Westmark, Nancy Adalyn, DMD;  Location: [facility];  Service: Plastic Surgery;  Laterality: N/A;   TRANSPLANT LIVER N/A 09/28/2020   Procedure: ADULT, LIVER ALLOTRANSPLANTATION; ORTHOTOPIC, PARTIAL OR WHOLE, FROM CADAVER OR LIVING  DONOR;  Surgeon: Ravindra, Kadiyala Venkata, MD;  Location: [facility];  Service: General Surgery;  Laterality: N/A;   BRONCHOSCOPY Bilateral 10/12/2020   Procedure: BRONCHOSCOPY, RIGID OR FLEXIBLE, INCLUDING FLUOROSCOPIC GUIDANCE, WHEN PERFORMED; WITH BRONCHIAL ALVEOLAR LAVAGE;  Surgeon: Mahmood, Kamran, MD;  Location: [facility];  Service: Pulmonary;  Laterality: Bilateral;   BRONCHOSCOPY W/TRANSBRONCHIAL BIOPSY FLEXIBLE Bilateral 10/12/2020   Procedure: BRONCHOSCOPY, FLEXIBLE, INCLUDING FLUOROSCOPIC GUIDANCE, WHEN PERFORMED; WITH TRANSBRONCHIAL LUNG BIOPSY(S), SINGLE LOBE;  Surgeon: Mahmood, Kamran, MD;  Location: [facility];  Service: Pulmonary;  Laterality: Bilateral;   BRONCHOSCOPY Bilateral 10/12/2020   Procedure: BRONCHOSCOPY, RIGID OR FLEXIBLE, INCL FLUORO GUIDE; W/ EBUS DURING BRONCH DIAGNOSTIC/THERAPEUTIC INTERVENTION FOR PERIPHERAL LESION (LIST SEPARATELY IN ADDITION TO CODE FOR PRIMARY PROCEDURE);  Surgeon: Mahmood, Kamran, MD;  Location: [facility];  Service: Pulmonary;  Laterality: Bilateral;   Lt mini-open rotator cuff repair, open biceps tenodesis, extensive debridement of shoulder (glenohumeral & subacromial spaces) subacromial decompression Left 07/07/2021   Dr. Patel   EYE SURGERY     finger surgery       FAMILY HISTORY: Family History  Problem Relation Age of Onset   High blood pressure (Hypertension) Mother    Diabetes type II Mother    Asthma Mother    Anesthesia problems Neg Hx    Malignant hyperthermia Neg Hx      SOCIAL HISTORY: Social History   Socioeconomic History   Marital status: Married    Spouse name: Evelyn Mathew   Number of children: 2   Years of education: 11   Highest education level: GED or equivalent  Occupational History   Occupation: Retired  Tobacco Use   Smoking status: Some Days    Packs/day: 0.00    Years: 0.00    Additional pack years: 0.00    Total pack years: 0.00    Types: Cigarettes   Smokeless tobacco:  Never   Tobacco comments:    Occasionally daily  Vaping Use   Vaping Use: Never used  Substance and Sexual Activity   Alcohol use: Not Currently    Alcohol/week: 0.0 standard drinks of alcohol    Comment: Haven't drank alcohol since October 2021   Drug use: Not Currently   Sexual activity: Defer    Partners: Female    Birth control/protection: None   Social Determinants of Health   Transportation Needs: No Transportation Needs (09/29/2020)     PRAPARE - Transportation    Lack of Transportation (Medical): No    Lack of Transportation (Non-Medical): No    PHYSICAL EXAM: Vitals:   01/26/22 1533  BP: (!) 150/76  Pulse: 57   Body mass index is 23.1 kg/m. Weight: 73 kg (161 lb)   GENERAL: Alert, active, oriented x3  HEENT: Pupils equal reactive to light. Extraocular movements are intact. Sclera clear. Palpebral conjunctiva normal red color.Pharynx clear.  NECK: Supple with no palpable mass and no adenopathy.  LUNGS: Sound clear with no rales rhonchi or wheezes.  HEART: Regular rhythm S1 and S2 without murmur.  ABDOMEN: Soft and depressible, nontender with no palpable mass, no hepatomegaly.  Right inguinal hernia, reducible.  Tenderness on the left groin.  EXTREMITIES: Well-developed well-nourished symmetrical with no dependent edema.  NEUROLOGICAL: Awake alert oriented, facial expression symmetrical, moving all extremities.  REVIEW OF DATA: I have reviewed the following data today: Orders Only on 01/17/2022  Component Date Value   White Blood Cell Count -* 01/17/2022 4.7    Red Blood Cell Count - L* 01/17/2022 3.93 (L)    Hemoglobin - [facility] 01/17/2022 11.3 (L)    Hematocrit - [facility] 01/17/2022 34.3 (L)    MCV - [facility] 01/17/2022 87    MCH  - [facility] 01/17/2022 28.8    MCHC - [facility] 01/17/2022 32.9    RDW - [facility] 01/17/2022 12.1    Platelets - [facility] 01/17/2022 110 (L)    Neutrophils - [facility] 01/17/2022 62    LYMPHS -  [facility] 01/17/2022 29    Monocytes -  [facility] 01/17/2022 6    Eos - [facility] 01/17/2022 3    Basos - [facility] 01/17/2022 0    Neutrophils (Absolute) -* 01/17/2022 2.9    Lymphs (Absolute) - [facility] 01/17/2022 1.4    Monocytes(Absolute) - [facility]* 01/17/2022 0.3    Eos (Absolute) - [facility] 01/17/2022 0.1    Baso (Absolute) - [facility] 01/17/2022 0.0    Immature Granulocytes - * 01/17/2022 0    Immature Grans (Abs) - L* 01/17/2022 0.0    Glucose Random - [facility] 01/17/2022 104 (H)    Blood Urea Nitrogen - [facility]* 01/17/2022 20    Creatinine  - [facility] 01/17/2022 1.49 (H)    EGFR (CKD-EPI 2021) - [facility]* 01/17/2022 51 (L)    Bun/Creatinine Ratio - L* 01/17/2022 13    Sodium - [facility] 01/17/2022 140    Potassium - [facility] 01/17/2022 5.0    Chloride - [facility] 01/17/2022 106    Carbon Dioxide - [facility] 01/17/2022 23    Calcium - [facility] 01/17/2022 9.4    Protein Total - [facility] 01/17/2022 7.0    Albumin - [facility] 01/17/2022 4.2    Globulin, Total - [facility] 01/17/2022 2.8    A/G Ratio - [facility] 01/17/2022 1.5    Bilirubin Total - [facility] 01/17/2022 0.8    Alkaline Phosphatase - L* 01/17/2022 118    AST (SGOT) - [facility] 01/17/2022 23    ALT (SGPT) - [facility] 01/17/2022 11    Magnesium - [facility] 01/17/2022 2.3    Cyclosporine by [facility]* 01/17/2022 210    CMV Quant DNA PCR (Plasm* 01/17/2022 Negative    log10 CMV Qn DNA Pl - [facility]* 01/17/2022 CANCELED   Orders Only on 12/06/2021  Component Date Value   White Blood Cell Count -* 12/06/2021 4.1    Red Blood Cell Count - L* 12/06/2021 3.49 (L)    Hemoglobin - [facility] 12/06/2021 10.2 (L)    Hematocrit - [facility] 12/06/2021 30.1 (L)      MCV - [facility] 12/06/2021 86    MCH  - [facility] 12/06/2021 29.2    MCHC - [facility] 12/06/2021 33.9    RDW - [facility] 12/06/2021 12.1    Platelets - [facility] 12/06/2021 98 (<)    Neutrophils - [facility] 12/06/2021 71    LYMPHS -  [facility] 12/06/2021 18    Monocytes - [facility] 12/06/2021 7    Eos - [facility] 12/06/2021 3    Basos - [facility] 12/06/2021 1    Neutrophils  (Absolute) -* 12/06/2021 2.9    Lymphs (Absolute) - [facility] 12/06/2021 0.7    Monocytes(Absolute) - [facility]* 12/06/2021 0.3    Eos (Absolute) - [facility] 12/06/2021 0.1    Baso (Absolute) - [facility] 12/06/2021 0.0    Immature Granulocytes - * 12/06/2021 0    Immature Grans (Abs) - L* 12/06/2021 0.0    Hematology Comments: - L* 12/06/2021 Note:    Glucose Random - [facility] 12/06/2021 114 (H)    Blood Urea Nitrogen - [facility]* 12/06/2021 20    Creatinine  - [facility] 12/06/2021 1.39 (H)    EGFR (CKD-EPI 2021) - [facility]* 12/06/2021 56 (L)    Bun/Creatinine Ratio - L* 12/06/2021 14    Sodium - [facility] 12/06/2021 143    Potassium - [facility] 12/06/2021 4.7    Chloride - [facility] 12/06/2021 106    Carbon Dioxide - [facility] 12/06/2021 23    Calcium - [facility] 12/06/2021 9.8    Protein Total - [facility] 12/06/2021 7.1    Albumin - [facility] 12/06/2021 4.5    Globulin, Total - [facility] 12/06/2021 2.6    A/G Ratio - [facility] 12/06/2021 1.7    Bilirubin Total - [facility] 12/06/2021 0.8    Alkaline Phosphatase - L* 12/06/2021 134 (H)    AST (SGOT) - [facility] 12/06/2021 24    ALT (SGPT) - [facility] 12/06/2021 16    Magnesium - [facility] 12/06/2021 1.9    Cyclosporine by [facility]* 12/06/2021 130    CMV Quant DNA PCR (Plasm* 12/06/2021 Negative    log10 CMV Qn DNA Pl - [facility]* 12/06/2021 CANCELED   Hospital Outpatient Visit on 11/17/2021  Component Date Value   POC Creatinine 11/17/2021 1.4 (H)   Office Visit on 11/17/2021  Component Date Value   HBV DNA Detect/Quant, S * 11/17/2021 Undetected    Hepatitis C RNA-PCR, Qua* 11/17/2021 Not Detected    Alpha Fetoprotein (AFP) 11/17/2021 2.0    WBC (White Blood Cell Co* 11/17/2021 4.3    Hemoglobin 11/17/2021 11.7 (L)    Hematocrit 11/17/2021 37.6 (L)    Platelets 11/17/2021 108 (L)    MCV (Mean Corpuscular Vo* 11/17/2021 91    MCH (Mean Corpuscular He* 11/17/2021 28.4    MCHC (Mean Corpuscular H* 11/17/2021 31.1 (L)    RBC (Red Blood Cell Coun* 11/17/2021 4.12 (L)    RDW-CV (Red Cell  Distrib* 11/17/2021 11.9    NRBC (Nucleated Red Bloo* 11/17/2021 0.00    NRBC % (Nucleated Red Bl* 11/17/2021 0.0    MPV (Mean Platelet Volum* 11/17/2021 11.2    Sodium 11/17/2021 139    Potassium 11/17/2021 5.0    Chloride 11/17/2021 110 (H)    Carbon Dioxide (CO2) 11/17/2021 22    Urea Nitrogen (BUN) 11/17/2021 20    Creatinine 11/17/2021 1.6 (H)    Glucose 11/17/2021 99    Calcium 11/17/2021 9.9    AST (Aspartate Aminotran* 11/17/2021 26    ALT (Alanine Aminotransf* 11/17/2021 15    Bilirubin, Total 11/17/2021 1.5    Alk Phos (Alkaline Phosp*   11/17/2021 99    Albumin 11/17/2021 4.4    Protein, Total 11/17/2021 7.6    Anion Gap 11/17/2021 7    BUN/CREA Ratio 11/17/2021 13    Glomerular Filtration Ra* 11/17/2021 47    Cyclosporine A (CSA) 11/17/2021 611 (H)    Magnesium 11/17/2021 2.0   Order Transcription on 11/14/2021  Component Date Value   White Blood Cell Count E* 11/10/2021 3.7    Red Blood Cell Count Ext* 11/10/2021 3.74 (L)    Hemoglobin External 11/10/2021 10.8 (L)    Hematocrit External 11/10/2021 32.7 (L)    Mean Cell Volume External 11/10/2021 87    Mean Corpuscular Hemoglo* 11/10/2021 28.9    Mean Corpuscular Hemoglo* 11/10/2021 33.0    RDW External 11/10/2021 11.8    Platelet Count External 11/10/2021 97 (L)    Neutrophils External 11/10/2021 69    Lymphocytes External 11/10/2021 21    Monocytes External 11/10/2021 6    Eosinophils External 11/10/2021 3    Basophils External 11/10/2021 1    Absolute Neutrophils Ext* 11/10/2021 2.5    Absolute Lymphocytes Ext* 11/10/2021 0.8    Absolute Monocytes Exter* 11/10/2021 0.2    Absolute Eosinophils Ext* 11/10/2021 0.1    Absolute Basophils Exter* 11/10/2021 0.0    Glucose External 11/10/2021 106 (H)    Urea Nitrogen  External 11/10/2021 21    Creatinine External 11/10/2021 1.52 (H)    Glomerular Filtration Ra* 11/10/2021 50 (L)    BUN/Creatinine Ratio Ext* 11/10/2021 14    Sodium External 11/10/2021 141     Potassium External 11/10/2021 5.1    Chloride External 11/10/2021 107 (H)    Carbon Dioxide External 11/10/2021 21    Calcium External 11/10/2021 9.9    Total Protein External 11/10/2021 7.5    Albumin External 11/10/2021 4.7    Total Bilirubin External 11/10/2021 1.3 (H)    Alkaline Phosphatase Ext* 11/10/2021 115    Aspartate Aminotransfera* 11/10/2021 19    Alanine Aminotransferase* 11/10/2021 11    CMV Quant DNA PCR Extern* 11/10/2021 Negative    Cyclosporine  External 11/10/2021 194    Magnesium External 11/10/2021 2.0   Orders Only on 11/10/2021  Component Date Value   Glucose Random - [facility] 11/10/2021 106 (H)    Blood Urea Nitrogen - [facility]* 11/10/2021 21    Creatinine  - [facility] 11/10/2021 1.52 (H)    EGFR (CKD-EPI 2021) - [facility]* 11/10/2021 50 (L)    Bun/Creatinine Ratio - L* 11/10/2021 14    Sodium - [facility] 11/10/2021 141    Potassium - [facility] 11/10/2021 5.1    Chloride - [facility] 11/10/2021 107 (H)    Carbon Dioxide - [facility] 11/10/2021 21    Calcium - [facility] 11/10/2021 9.9    Protein Total - [facility] 11/10/2021 7.5    Albumin - [facility] 11/10/2021 4.7    Globulin, Total - [facility] 11/10/2021 2.8    A/G Ratio - [facility] 11/10/2021 1.7    Bilirubin Total - [facility] 11/10/2021 1.3 (H)    Alkaline Phosphatase - L* 11/10/2021 115    AST (SGOT) - [facility] 11/10/2021 19    ALT (SGPT) - [facility] 11/10/2021 11    Magnesium - [facility] 11/10/2021 2.0    White Blood Cell Count -* 11/10/2021 3.7    Red Blood Cell Count - L* 11/10/2021 3.74 (L)    Hemoglobin - [facility] 11/10/2021 10.8 (L)    Hematocrit - [facility] 11/10/2021 32.7 (L)    MCV - [facility] 11/10/2021 87    MCH  -   [facility] 11/10/2021 28.9    MCHC - [facility] 11/10/2021 33.0    RDW - [facility] 11/10/2021 11.8    Platelets - [facility] 11/10/2021 97 (<)    Neutrophils - [facility] 11/10/2021 69    LYMPHS -  [facility] 11/10/2021 21    Monocytes - [facility] 11/10/2021 6    Eos - [facility] 11/10/2021 3    Basos - [facility] 11/10/2021 1     Neutrophils (Absolute) -* 11/10/2021 2.5    Lymphs (Absolute) - [facility] 11/10/2021 0.8    Monocytes(Absolute) - [facility]* 11/10/2021 0.2    Eos (Absolute) - [facility] 11/10/2021 0.1    Baso (Absolute) - [facility] 11/10/2021 0.0    Immature Granulocytes - * 11/10/2021 0    Immature Grans (Abs) - L* 11/10/2021 0.0    Hematology Comments: - L* 11/10/2021 Note:    Cyclosporine by [facility]* 11/10/2021 194    CMV Quant DNA PCR (Plasm* 11/10/2021 Negative    log10 CMV Qn DNA Pl - [facility]* 11/10/2021 CANCELED   Orders Only on 10/28/2021  Component Date Value   White Blood Cell Count -* 10/28/2021 3.5    Red Blood Cell Count - L* 10/28/2021 3.68 (L)    Hemoglobin - [facility] 10/28/2021 10.6 (L)    Hematocrit - [facility] 10/28/2021 32.9 (L)    MCV - [facility] 10/28/2021 89    MCH  - [facility] 10/28/2021 28.8    MCHC - [facility] 10/28/2021 32.2    RDW - [facility] 10/28/2021 11.9    Platelets - [facility] 10/28/2021 97 (<)    Neutrophils - [facility] 10/28/2021 68    LYMPHS -  [facility] 10/28/2021 25    Monocytes - [facility] 10/28/2021 5    Eos - [facility] 10/28/2021 2    Basos - [facility] 10/28/2021 0    Neutrophils (Absolute) -* 10/28/2021 2.4    Lymphs (Absolute) - [facility] 10/28/2021 0.9    Monocytes(Absolute) - [facility]* 10/28/2021 0.2    Eos (Absolute) - [facility] 10/28/2021 0.1    Baso (Absolute) - [facility] 10/28/2021 0.0    Immature Granulocytes - * 10/28/2021 0    Immature Grans (Abs) - L* 10/28/2021 0.0    Hematology Comments: - L* 10/28/2021 Note:    Glucose Random - [facility] 10/28/2021 103 (H)    Blood Urea Nitrogen - [facility]* 10/28/2021 23    Creatinine  - [facility] 10/28/2021 1.55 (H)    EGFR (CKD-EPI 2021) - [facility]* 10/28/2021 49 (L)    Bun/Creatinine Ratio - L* 10/28/2021 15    Sodium - [facility] 10/28/2021 140    Potassium - [facility] 10/28/2021 5.4 (H)    Chloride - [facility] 10/28/2021 106    Carbon Dioxide - [facility] 10/28/2021 21    Calcium - [facility] 10/28/2021 9.9    Protein Total - [facility] 10/28/2021 7.5    Albumin - [facility]  10/28/2021 4.7    Globulin, Total - [facility] 10/28/2021 2.8    A/G Ratio - [facility] 10/28/2021 1.7    Bilirubin Total - [facility] 10/28/2021 0.9    Alkaline Phosphatase - L* 10/28/2021 115    AST (SGOT) - [facility] 10/28/2021 21    ALT (SGPT) - [facility] 10/28/2021 14    CMV Quant DNA PCR (Plasm* 10/28/2021 Positive < 200    log10 CMV Qn DNA Pl - [facility]* 10/28/2021 CANCELED    Cyclosporine by [facility]* 10/28/2021 138    Magnesium - [facility] 10/28/2021 2.0      ASSESSMENT: Mr. Egnor is a 67 y.o. male presenting for consultation for right versus bilateral inguinal hernia.      The patient presents with a symptomatic, reducible inguinal hernia. Patient was oriented about the diagnosis of inguinal hernia and its implication. The patient was oriented about the treatment alternatives (observation vs surgical repair). Due to patient symptoms, repair is recommended. Patient oriented about the surgical procedure, the use of mesh and its risk of complications such as: infection, bleeding, injury to vas deference, vasculature and testicle, injury to bowel or bladder, and chronic pain.   Patient is s/p liver transplant.  He is very stable.  Labs are within normal limits.  Patient is very active, no chest pain, no shortness of breath.  Patient can achieve 4 METS without chest pain or shortness of breath.  He understand the possibility of needing open hernia repair if adhesions does not let me get to the right plain.  Non-recurrent unilateral inguinal hernia without obstruction or gangrene [K40.90]  PLAN: 1.  Robotic assisted laparoscopic right vs bilateral inguinal hernia repair with mesh ([id number]) 2.  CBC, CMP (done) 3.  Contact us if has any question or concern.   Patient and is wife verbalized understanding, all questions were answered, and were agreeable with the plan outlined above.   

## 2022-01-27 ENCOUNTER — Encounter: Payer: Self-pay | Admitting: Urology

## 2022-02-07 ENCOUNTER — Other Ambulatory Visit: Payer: Self-pay

## 2022-02-07 ENCOUNTER — Encounter
Admission: RE | Admit: 2022-02-07 | Discharge: 2022-02-07 | Disposition: A | Discharge status: Home / Self Care | Payer: 59 | Source: Ambulatory Visit | Attending: General Surgery | Admitting: General Surgery

## 2022-02-07 HISTORY — DX: Anemia, unspecified: D64.9

## 2022-02-07 HISTORY — DX: Essential (primary) hypertension: I10

## 2022-02-07 HISTORY — DX: Personal history of urinary calculi: Z87.442

## 2022-02-07 HISTORY — DX: Liver cell carcinoma: C22.0

## 2022-02-07 NOTE — Patient Instructions (Addendum)
Your procedure is scheduled on: 02/17/22 Report to [place]. To find out your arrival time please call [phone] between 1PM - 3PM on 02/16/22 .  Remember: Instructions that are not followed completely may result in serious medical risk, up to and including death, or upon the discretion of your surgeon and anesthesiologist your surgery may need to be rescheduled.     _X__ 1. Do not eat food or drink any liquids after midnight the night before your procedure.                 No gum chewing or hard candies.  __X__2.  On the morning of surgery brush your teeth with toothpaste and water, you                 may rinse your mouth with mouthwash if you wish.  Do not swallow any              toothpaste of mouthwash.     _X__ 3.  No Alcohol for 24 hours before or after surgery.   _X__ 4.  Do Not Smoke or use e-cigarettes For 24 Hours Prior to Your Surgery.                 Do not use any chewable tobacco products for at least 6 hours prior to                 surgery.  ____  5.  Bring all medications with you on the day of surgery if instructed.   __X__  6.  Notify your doctor if there is any change in your medical condition      (cold, fever, infections).     Do not wear jewelry, make-up, hairpins, clips or nail polish. Do not wear lotions, powders, or perfumes. You may use deodorant Do not shave body hair 48 hours prior to surgery. Men may shave face and neck. Do not bring valuables to the hospital.    [facility] is not responsible for any belongings or valuables.  Contacts, dentures/partials or body piercings may not be worn into surgery. Bring a case for your contacts, glasses or hearing aids, a denture cup will be supplied. Leave your suitcase or overnight bag in the car. After surgery it may be brought to your room. For patients admitted to the hospital, discharge time is determined by your treatment team. In case of increased  patient census, it may be necessary for you, the patient, to continue your postoperative care within the Same Day Surgery department.   Patients discharged the day of surgery will need a responsible, legally licensed driver to drive them home. A learners permit is NOT acceptable. The driver must be free of impairment including anesthesia medications You will not be allowed to drive yourself home. If you are going home via [place], [place], Taxi or other public transportation (non medical) you MUST be accompanied be a responsible individual.    Please read over the following fact sheets that you were given:   MRSA Information, CHG soap  __X__ Take these medicines the morning of surgery with A SIP OF WATER:    1. cycloSPORINE modified (GENGRAF) 125 MG capsule  2. mycophenolate (CELLCEPT) 1000 MG tablet   3. oxyCODONE (OXY IR/ROXICODONE) 15 MG immediate release tablet if needed  4.  5.  6.  ____ Fleet Enema (as directed)   __X__ Use CHG Soap/SAGE wipes as directed  ____  Use inhalers on the day of surgery  ____ Stop metformin/Janumet/Farxiga 2 days prior to surgery    ____ Take 1/2 of usual insulin dose the night before surgery. No insulin the morning          of surgery.   ____ Stop Blood Thinners Coumadin/Plavix/Xarelto/Pleta/Pradaxa/Eliquis/Effient/Aspirin  on   Or contact your Surgeon, Cardiologist or Medical Doctor regarding  ability to stop your blood thinners  __X__ Stop Anti-inflammatories 7 days before surgery such as Advil, Ibuprofen, Motrin,  BC or Goodies Powder, Naprosyn, Naproxen, Aleve, Aspirin    __X__ Stop all herbals and supplements, fish oil or vitamins 7 days before surgery.      ____ Bring C-Pap to the hospital.      Preparing for Surgery with CHLORHEXIDINE GLUCONATE (CHG) Soap  Chlorhexidine Gluconate (CHG) Soap  o An antiseptic cleaner that kills germs and bonds with the skin to continue killing germs even after washing  o Used for showering the night before  surgery and morning of surgery  Before surgery, you can play an important role by reducing the number of germs on your skin.  CHG (Chlorhexidine gluconate) soap is an antiseptic cleanser which kills germs and bonds with the skin to continue killing germs even after washing.  Please do not use if you have an allergy to CHG or antibacterial soaps. If your skin becomes reddened/irritated stop using the CHG.  1. Shower the NIGHT BEFORE SURGERY and the MORNING OF SURGERY with CHG soap.  2. If you choose to wash your hair, wash your hair first as usual with your normal shampoo.  3. After shampooing, rinse your hair and body thoroughly to remove the shampoo.  4. Use CHG as you would any other liquid soap. You can apply CHG directly to the skin and wash gently with a scrungie or a clean washcloth.  5. Apply the CHG soap to your body only from the neck down. Do not use on open wounds or open sores. Avoid contact with your eyes, ears, mouth, and genitals (private parts). Wash face and genitals (private parts) with your normal soap.  6. Wash thoroughly, paying special attention to the area where your surgery will be performed.  7. Thoroughly rinse your body with warm water.  8. Do not shower/wash with your normal soap after using and rinsing off the CHG soap.  9. Pat yourself dry with a clean towel.  10. Wear clean pajamas to bed the night before surgery.  12. Place clean sheets on your bed the night of your first shower and do not sleep with pets.  13. Shower again with the CHG soap on the day of surgery prior to arriving at the hospital.  14. Do not apply any deodorants/lotions/powders.  15. Please wear clean clothes to the hospital.

## 2022-02-16 MED ORDER — FAMOTIDINE 20 MG PO TABS: 20.0000 mg | ORAL_TABLET | Freq: Once | ORAL | Status: AC

## 2022-02-16 MED ORDER — ORAL CARE MOUTH RINSE: 15.0000 mL | Freq: Once | OROMUCOSAL | Status: AC

## 2022-02-16 MED ORDER — CHLORHEXIDINE GLUCONATE 0.12 % MT SOLN: 15.0000 mL | Freq: Once | OROMUCOSAL | Status: AC

## 2022-02-16 MED ORDER — LACTATED RINGERS IV SOLN: INTRAVENOUS | Status: DC

## 2022-02-16 MED ORDER — CEFAZOLIN SODIUM-DEXTROSE 2-4 GM/100ML-% IV SOLN
2.0000 g | INTRAVENOUS | Status: AC
  Administered 2022-02-17: 2 g via INTRAVENOUS

## 2022-02-17 ENCOUNTER — Encounter: Payer: Self-pay | Admitting: General Surgery

## 2022-02-17 ENCOUNTER — Encounter
Admission: RE | Disposition: A | Discharge status: Home / Self Care | Payer: Self-pay | Source: Ambulatory Visit | Attending: General Surgery

## 2022-02-17 ENCOUNTER — Ambulatory Visit: Payer: 59 | Admitting: Urgent Care

## 2022-02-17 ENCOUNTER — Other Ambulatory Visit: Payer: Self-pay | Admitting: Surgery

## 2022-02-17 ENCOUNTER — Other Ambulatory Visit: Payer: Self-pay | Admitting: General Surgery

## 2022-02-17 ENCOUNTER — Other Ambulatory Visit: Payer: Self-pay

## 2022-02-17 ENCOUNTER — Ambulatory Visit
Admission: RE | Admit: 2022-02-17 | Discharge: 2022-02-17 | Disposition: A | Discharge status: Home / Self Care | Payer: 59 | Source: Ambulatory Visit | Attending: General Surgery | Admitting: General Surgery

## 2022-02-17 DIAGNOSIS — Z944 Liver transplant status: Secondary | ICD-10-CM | POA: Insufficient documentation

## 2022-02-17 DIAGNOSIS — K4020 Bilateral inguinal hernia, without obstruction or gangrene, not specified as recurrent: Secondary | ICD-10-CM | POA: Insufficient documentation

## 2022-02-17 DIAGNOSIS — Z87891 Personal history of nicotine dependence: Secondary | ICD-10-CM | POA: Insufficient documentation

## 2022-02-17 DIAGNOSIS — Z87442 Personal history of urinary calculi: Secondary | ICD-10-CM | POA: Insufficient documentation

## 2022-02-17 DIAGNOSIS — D176 Benign lipomatous neoplasm of spermatic cord: Secondary | ICD-10-CM | POA: Insufficient documentation

## 2022-02-17 DIAGNOSIS — Z8505 Personal history of malignant neoplasm of liver: Secondary | ICD-10-CM | POA: Insufficient documentation

## 2022-02-17 DIAGNOSIS — I10 Essential (primary) hypertension: Secondary | ICD-10-CM | POA: Insufficient documentation

## 2022-02-17 HISTORY — PX: INSERTION OF MESH: SHX5868

## 2022-02-17 LAB — GLUCOSE, CAPILLARY: Glucose-Capillary: 186 mg/dL — ABNORMAL HIGH (ref 70–99)

## 2022-02-17 SURGERY — REPAIR, HERNIA, INGUINAL, BILATERAL, ROBOT-ASSISTED
Anesthesia: General | Site: Groin | Laterality: Bilateral

## 2022-02-17 MED ORDER — ONDANSETRON HCL 4 MG/2ML IJ SOLN: 4.0000 mg | Freq: Once | INTRAMUSCULAR | Status: DC | PRN

## 2022-02-17 MED ORDER — HYDROMORPHONE HCL 1 MG/ML IJ SOLN
INTRAMUSCULAR | Status: AC
  Filled 2022-02-17: qty 1

## 2022-02-17 MED ORDER — HYDROCODONE-ACETAMINOPHEN 5-325 MG PO TABS: 1.0000 | ORAL_TABLET | ORAL | 0 refills | Status: DC | PRN

## 2022-02-17 MED ORDER — FENTANYL CITRATE (PF) 100 MCG/2ML IJ SOLN
INTRAMUSCULAR | Status: DC | PRN
  Administered 2022-02-17 (×2): 50 ug via INTRAVENOUS

## 2022-02-17 MED ORDER — FENTANYL CITRATE (PF) 100 MCG/2ML IJ SOLN
INTRAMUSCULAR | Status: AC
  Filled 2022-02-17: qty 2

## 2022-02-17 MED ORDER — BUPIVACAINE HCL (PF) 0.25 % IJ SOLN
INTRAMUSCULAR | Status: AC
  Filled 2022-02-17: qty 30

## 2022-02-17 MED ORDER — CHLORHEXIDINE GLUCONATE 0.12 % MT SOLN
OROMUCOSAL | Status: AC
  Administered 2022-02-17: 15 mL via OROMUCOSAL
  Filled 2022-02-17: qty 15

## 2022-02-17 MED ORDER — ONDANSETRON HCL 4 MG/2ML IJ SOLN
INTRAMUSCULAR | Status: DC | PRN
  Administered 2022-02-17: 4 mg via INTRAVENOUS

## 2022-02-17 MED ORDER — BUPIVACAINE HCL 0.25 % IJ SOLN
INTRAMUSCULAR | Status: DC | PRN
  Administered 2022-02-17: 30 mL

## 2022-02-17 MED ORDER — HYDROCODONE-ACETAMINOPHEN 5-325 MG PO TABS: 1.0000 | ORAL_TABLET | ORAL | 0 refills | Status: AC | PRN

## 2022-02-17 MED ORDER — OXYCODONE HCL 5 MG PO TABS
5.0000 mg | ORAL_TABLET | Freq: Once | ORAL | Status: AC
  Administered 2022-02-17: 5 mg via ORAL

## 2022-02-17 MED ORDER — FENTANYL CITRATE (PF) 100 MCG/2ML IJ SOLN
25.0000 ug | INTRAMUSCULAR | Status: DC | PRN
  Administered 2022-02-17: 50 ug via INTRAVENOUS
  Administered 2022-02-17 (×2): 25 ug via INTRAVENOUS

## 2022-02-17 MED ORDER — PROPOFOL 10 MG/ML IV BOLUS
INTRAVENOUS | Status: DC | PRN
  Administered 2022-02-17: 200 mg via INTRAVENOUS

## 2022-02-17 MED ORDER — HYDROMORPHONE HCL 1 MG/ML IJ SOLN
INTRAMUSCULAR | Status: DC | PRN
  Administered 2022-02-17: .5 mg via INTRAVENOUS

## 2022-02-17 MED ORDER — SUGAMMADEX SODIUM 200 MG/2ML IV SOLN
INTRAVENOUS | Status: DC | PRN
  Administered 2022-02-17: 200 mg via INTRAVENOUS

## 2022-02-17 MED ORDER — CEFAZOLIN SODIUM-DEXTROSE 2-4 GM/100ML-% IV SOLN
INTRAVENOUS | Status: AC
  Filled 2022-02-17: qty 100

## 2022-02-17 MED ORDER — DEXMEDETOMIDINE HCL IN NACL 200 MCG/50ML IV SOLN
INTRAVENOUS | Status: DC | PRN
  Administered 2022-02-17 (×3): 4 ug via INTRAVENOUS

## 2022-02-17 MED ORDER — MIDAZOLAM HCL 2 MG/2ML IJ SOLN
INTRAMUSCULAR | Status: DC | PRN
  Administered 2022-02-17: 2 mg via INTRAVENOUS

## 2022-02-17 MED ORDER — FAMOTIDINE 20 MG PO TABS
ORAL_TABLET | ORAL | Status: AC
  Administered 2022-02-17: 20 mg via ORAL
  Filled 2022-02-17: qty 1

## 2022-02-17 MED ORDER — LACTATED RINGERS IV SOLN: INTRAVENOUS | Status: DC | PRN

## 2022-02-17 MED ORDER — OXYCODONE HCL 5 MG PO TABS
ORAL_TABLET | ORAL | Status: AC
  Filled 2022-02-17: qty 1

## 2022-02-17 MED ORDER — MIDAZOLAM HCL 2 MG/2ML IJ SOLN
INTRAMUSCULAR | Status: AC
  Filled 2022-02-17: qty 2

## 2022-02-17 MED ORDER — ROCURONIUM BROMIDE 100 MG/10ML IV SOLN
INTRAVENOUS | Status: DC | PRN
  Administered 2022-02-17: 50 mg via INTRAVENOUS
  Administered 2022-02-17: 10 mg via INTRAVENOUS

## 2022-02-17 MED ORDER — DEXAMETHASONE SODIUM PHOSPHATE 10 MG/ML IJ SOLN
INTRAMUSCULAR | Status: DC | PRN
  Administered 2022-02-17: 5 mg via INTRAVENOUS

## 2022-02-17 SURGICAL SUPPLY — 50 items
ADH SKN CLS APL DERMABOND .7 (GAUZE/BANDAGES/DRESSINGS) ×2
BAG PRESSURE INF REUSE 1000 (BAG) IMPLANT
BLADE SURG SZ11 CARB STEEL (BLADE) ×2 IMPLANT
COVER TIP SHEARS 8 DVNC (MISCELLANEOUS) ×2 IMPLANT
COVER TIP SHEARS 8MM DA VINCI (MISCELLANEOUS) ×2
COVER WAND RF STERILE (DRAPES) ×2 IMPLANT
DERMABOND ADVANCED .7 DNX12 (GAUZE/BANDAGES/DRESSINGS) ×2 IMPLANT
DRAPE ARM DVNC X/XI (DISPOSABLE) ×6 IMPLANT
DRAPE COLUMN DVNC XI (DISPOSABLE) ×2 IMPLANT
DRAPE DA VINCI XI ARM (DISPOSABLE) ×6
DRAPE DA VINCI XI COLUMN (DISPOSABLE) ×2
ELECT REM PT RETURN 9FT ADLT (ELECTROSURGICAL) ×2
ELECTRODE REM PT RTRN 9FT ADLT (ELECTROSURGICAL) ×2 IMPLANT
GLOVE BIO SURGEON STRL SZ 6.5 (GLOVE) ×4 IMPLANT
GLOVE BIOGEL PI IND STRL 6.5 (GLOVE) ×4 IMPLANT
GOWN STRL REUS W/ TWL LRG LVL3 (GOWN DISPOSABLE) ×6 IMPLANT
GOWN STRL REUS W/TWL LRG LVL3 (GOWN DISPOSABLE) ×8
IRRIGATOR SUCT 8 DISP DVNC XI (IRRIGATION / IRRIGATOR) IMPLANT
IRRIGATOR SUCTION 8MM XI DISP (IRRIGATION / IRRIGATOR)
IV CATH ANGIO 12GX3 LT BLUE (NEEDLE) IMPLANT
IV NS 1000ML (IV SOLUTION)
IV NS 1000ML BAXH (IV SOLUTION) IMPLANT
KIT PINK PAD W/HEAD ARE REST (MISCELLANEOUS) ×2
KIT PINK PAD W/HEAD ARM REST (MISCELLANEOUS) ×2 IMPLANT
LABEL OR SOLS (LABEL) IMPLANT
MANIFOLD NEPTUNE II (INSTRUMENTS) ×2 IMPLANT
MESH 3DMAX MID 5X7 LT XLRG (Mesh General) IMPLANT
MESH 3DMAX MID 5X7 RT XLRG (Mesh General) IMPLANT
NDL INSUFFLATION 14GA 120MM (NEEDLE) ×2 IMPLANT
NEEDLE HYPO 22GX1.5 SAFETY (NEEDLE) ×2 IMPLANT
NEEDLE INSUFFLATION 14GA 120MM (NEEDLE) ×2 IMPLANT
OBTURATOR OPTICAL STANDARD 8MM (TROCAR) ×2
OBTURATOR OPTICAL STND 8 DVNC (TROCAR) ×2
OBTURATOR OPTICALSTD 8 DVNC (TROCAR) ×2 IMPLANT
PACK LAP CHOLECYSTECTOMY (MISCELLANEOUS) ×2 IMPLANT
SEAL CANN UNIV 5-8 DVNC XI (MISCELLANEOUS) ×6 IMPLANT
SEAL XI 5MM-8MM UNIVERSAL (MISCELLANEOUS) ×6
SET TUBE SMOKE EVAC HIGH FLOW (TUBING) ×2 IMPLANT
SOL ELECTROSURG ANTI STICK (MISCELLANEOUS) ×2
SOLUTION ELECTROSURG ANTI STCK (MISCELLANEOUS) ×2 IMPLANT
SUT MNCRL 4-0 (SUTURE) ×2
SUT MNCRL 4-0 27XMFL (SUTURE) ×2
SUT VIC AB 2-0 SH 27 (SUTURE) ×2
SUT VIC AB 2-0 SH 27XBRD (SUTURE) ×2 IMPLANT
SUT VLOC 90 S/L VL9 GS22 (SUTURE) ×2 IMPLANT
SUTURE MNCRL 4-0 27XMF (SUTURE) ×2 IMPLANT
TAPE TRANSPORE STRL 2 31045 (GAUZE/BANDAGES/DRESSINGS) IMPLANT
TRAP FLUID SMOKE EVACUATOR (MISCELLANEOUS) ×2 IMPLANT
TRAY FOLEY MTR SLVR 16FR STAT (SET/KITS/TRAYS/PACK) ×2 IMPLANT
WATER STERILE IRR 500ML POUR (IV SOLUTION) ×2 IMPLANT

## 2022-02-17 NOTE — Interval H&P Note (Signed)
History and Physical Interval Note:  02/17/2022 6:51 AM  Jaime Powell  has presented today for surgery, with the diagnosis of K40.90 non recurrent unilateral inguinal hernia w/o obstruction or gangrene.  The various methods of treatment have been discussed with the patient and family. After consideration of risks, benefits and other options for treatment, the patient has consented to  Procedure(s): XI ROBOTIC ASSISTED BILATERAL INGUINAL HERNIA (Bilateral) as a surgical intervention.  The patient's history has been reviewed, patient examined, no change in status, stable for surgery.  I have reviewed the patient's chart and labs.  Questions were answered to the patient's satisfaction.     Herbert Pun

## 2022-02-17 NOTE — Progress Notes (Signed)
Patient's wife reports inability to obtain Rx from [facility], requested it be sent to [facility].  This RN informed PACU charge and Dr. Berneice Gandy request for Rx to be sent to [facility] instead.

## 2022-02-17 NOTE — Transfer of Care (Signed)
Immediate Anesthesia Transfer of Care Note  Patient: Jaime Powell  Procedure(s) Performed: XI ROBOTIC ASSISTED BILATERAL INGUINAL HERNIA (Bilateral: Groin) INSERTION OF MESH  Patient Location: PACU  Anesthesia Type:General  Level of Consciousness: awake, alert , and oriented  Airway & Oxygen Therapy: Patient Spontanous Breathing and Patient connected to face mask oxygen  Post-op Assessment: Report given to RN and Post -op Vital signs reviewed and stable  Post vital signs: Reviewed and stable  Last Vitals:  Vitals Value Taken Time  BP 158/87 02/17/22 0938  Temp 36.1 C 02/17/22 0938  Pulse 60 02/17/22 0941  Resp 14 02/17/22 0941  SpO2 100 % 02/17/22 0941  Vitals shown include unvalidated device data.  Last Pain:  Vitals:   02/17/22 0938  TempSrc:   PainSc: 0-No pain         Complications: No notable events documented.

## 2022-02-17 NOTE — Progress Notes (Signed)
[facility] in [place] can't fill Rx for Vicodin.  Resending to [facility].

## 2022-02-17 NOTE — Discharge Instructions (Addendum)
  Diet: Resume home heart healthy regular diet.   Activity: No heavy lifting >20 pounds (children, pets, laundry, garbage) or strenuous activity until follow-up, but light activity and walking are encouraged. Do not drive or drink alcohol if taking narcotic pain medications.  Wound care: May shower with soapy water and pat dry (do not rub incisions), but no baths or submerging incision underwater until follow-up. (no swimming)   Medications: Resume all home medications. For mild to moderate pain: acetaminophen (Tylenol) ***or ibuprofen (if no kidney disease). Combining Tylenol with alcohol can substantially increase your risk of causing liver disease. Narcotic pain medications, if prescribed, can be used for severe pain, though may cause nausea, constipation, and drowsiness. Do not combine Tylenol and Norco within a 6 hour period as Norco contains Tylenol. If you do not need the narcotic pain medication, you do not need to fill the prescription.  Call office ([phone]) at any time if any questions, worsening pain, fevers/chills, bleeding, drainage from incision site, or other concerns.   AMBULATORY SURGERY  DISCHARGE INSTRUCTIONS   The drugs that you were given will stay in your system until tomorrow so for the next 24 hours you should not:  Drive an automobile Make any legal decisions Drink any alcoholic beverage   You may resume regular meals tomorrow.  Today it is better to start with liquids and gradually work up to solid foods.  You may eat anything you prefer, but it is better to start with liquids, then soup and crackers, and gradually work up to solid foods.   Please notify your doctor immediately if you have any unusual bleeding, trouble breathing, redness and pain at the surgery site, drainage, fever, or pain not relieved by medication.    Additional Instructions:        Please contact your physician with any problems or Same Day Surgery at [phone], Monday  through Friday 6 am to 4 pm, or [place] [facility] Main number at [phone].  

## 2022-02-17 NOTE — Op Note (Signed)
Preoperative diagnosis: Bilateral inguinal hernia.   Postoperative diagnosis: Bilateral inguinal hernia.  Procedure: Robotic assisted Laparoscopic Transabdominal preperitoneal laparoscopic (TAPP) repair of bilateral inguinal hernia.  Anesthesia: GETA  Surgeon: Dr. Windell Moment  Wound Classification: Clean  Indications:  Patient is a 67 y.o. male developed a symptomatic bilateral inguinal hernia. Repair was indicated.  Findings: 1. Right indirect, left direct Inguinal hernia identified 2. Vas deferens and cord structures identified and preserved 3. Bard Extra Large 3D Max MID Anatomical mesh used for repair 4. Adequate hemostasis.   Description of procedure:  The patient was taken to the operating room and the correct side of surgery was verified. The patient was placed supine with right arm tucked at the side. After obtaining adequate anesthesia, the patient's abdomen was prepped and draped in standard sterile fashion. A time-out was completed verifying correct patient, procedure, site, positioning, and implant(s) and/or special equipment prior to beginning this procedure.  An incision was made in a natural skin line above the umbilicus. The fascia was elevated and the Veress needle inserted. Proper position was confirmed by aspiration and saline meniscus test.  The abdomen was insufflated with carbon dioxide to a pressure of 15 mmHg. The patient tolerated insufflation well.  Abdominal cavity was entered using Optiview technique with a millimeter trocar.  No injury was identified.  Another 2 mm trocars were placed lateral to each rectus muscle.  Scissors and bipolar forceps were inserted under direct visualization. At the robotic console: Transverse peritoneal incision is made about 8 cm superior to the inguinal defect. Medial to the epigastric vessels, the parietal compartment is dissected to visualize the rectus muscle. This is carried down to the symphysis pubis and the retropubic space is  dissected to expose at least 2 cm contralateral to the midline. Cooper's ligament is exposed and cleared at least 2 cm below the ligament to ensure adequate space for the inferior border of the mesh. Hesselbach's triangle is cleared assessing for a direct hernia. The left direct hernia is reduced dissecting the contents away from the border of the transversalis (white) fascia. On the right side, lateral to the epigastric vessels, the dissection is carried out in visceral compartment continuing in the true preperitoneal plane. Indirect hernia sac, was carefully reduced and separated from the cord structures with medial retraction and a combination of blunt/sharp dissection and focused cautery. This dissection was continued until the cord structures are "parietalized" completely, allowing for visualization of the reflected peritoneum that is continuous with the line originating 2 cm below Coopers medially and across the psoas muscle in the lateral compartment.  The internal ring was interrogated for a cord lipoma. The cord lipoma was reduced to the retroperitoneum and seated dorsal to the preperitoneal mesh. Having achieved a complete dissection with a critical view of the entire myopectineal orifice, an XL mesh was then positioned centered at the iliopubic tract with the medial side crossing the midline and the inferior edge positioned 2 cm below Coopers ligament. The lateral aspect of the mesh extended 3-5 cm beyond the lateral edge of the psoas. The mesh is fixated using an interrupted suture placed to the ipsilateral Coopers ligament. A second suture was done at the medial superior aspect of the mesh fixating this to the rectus complex.  The peritoneal flap is closed with running barbed suture. Additional holes in the peritoneum closed with suture. Preperitoneal space gas aspirated to visualize the peritoneum apposed directly against the mesh and ensure no folding, lifting, or buckling of the mesh.  Skin is  closed, sterile dressings are applied.  The patient tolerated the procedure well and was taken to the postanesthesia care unit in stable condition  Specimen: None  Complications: None  Estimated Blood Loss: 5 mL

## 2022-02-17 NOTE — Anesthesia Procedure Notes (Addendum)
Procedure Name: MAC Date/Time: 02/17/2022 7:40 AM  Performed by: Louann Sjogren, CRNAPre-anesthesia Checklist: Patient identified, Patient being monitored, Timeout performed, Emergency Drugs available and Suction available Patient Re-evaluated:Patient Re-evaluated prior to induction Oxygen Delivery Method: Circle system utilized Preoxygenation: Pre-oxygenation with 100% oxygen Induction Type: IV induction Ventilation: Mask ventilation without difficulty Laryngoscope Size: Mac and 4 Grade View: Grade II Tube type: Oral Tube size: 7.0 mm Number of attempts: 1 Airway Equipment and Method: Stylet Placement Confirmation: ETT inserted through vocal cords under direct vision, positive ETCO2 and breath sounds checked- equal and bilateral Secured at: 21 cm Tube secured with: Tape Dental Injury: Teeth and Oropharynx as per pre-operative assessment

## 2022-02-17 NOTE — Anesthesia Preprocedure Evaluation (Signed)
Anesthesia Evaluation  Patient identified by MRN, date of birth, ID band Patient awake    Reviewed: Allergy & Precautions, H&P , NPO status , Patient's Chart, lab work & pertinent test results, reviewed documented beta blocker date and time   History of Anesthesia Complications Negative for: history of anesthetic complications  Airway Mallampati: II  TM Distance: >3 FB Neck ROM: full    Dental   Pulmonary neg pulmonary ROS, former smoker   Pulmonary exam normal breath sounds clear to auscultation       Cardiovascular Exercise Tolerance: Good hypertension, (-) angina (-) Past MI and (-) Cardiac Stents Normal cardiovascular exam(-) dysrhythmias (-) Valvular Problems/Murmurs Rhythm:regular Rate:Normal     Neuro/Psych negative neurological ROS  negative psych ROS   GI/Hepatic negative GI ROS,,,Liver transplant 9/23   Endo/Other  negative endocrine ROS    Renal/GU negative Renal ROS  negative genitourinary   Musculoskeletal   Abdominal   Peds  Hematology negative hematology ROS (+)   Anesthesia Other Findings Past Medical History: No date: Anemia No date: degenerative disc disease No date: Hepatitis No date: Hepatocellular carcinoma (HCC) No date: History of kidney stones No date: Hypertension 2022: Liver transplanted ([place]) 2019: Sepsis ([place])   Reproductive/Obstetrics negative OB ROS                             Anesthesia Physical Anesthesia Plan  ASA: 2  Anesthesia Plan: General   Post-op Pain Management:    Induction: Intravenous  PONV Risk Score and Plan: 2 and Ondansetron, Dexamethasone, Midazolam and Treatment may vary due to age or medical condition  Airway Management Planned: Oral ETT  Additional Equipment:   Intra-op Plan:   Post-operative Plan:   Informed Consent: I have reviewed the patients History and Physical, chart, labs and discussed the procedure including  the risks, benefits and alternatives for the proposed anesthesia with the patient or authorized representative who has indicated his/her understanding and acceptance.     Dental Advisory Given  Plan Discussed with: Anesthesiologist, CRNA and Surgeon  Anesthesia Plan Comments:        Anesthesia Quick Evaluation

## 2022-02-20 ENCOUNTER — Encounter: Payer: Self-pay | Admitting: General Surgery

## 2022-02-23 NOTE — Anesthesia Postprocedure Evaluation (Signed)
Anesthesia Post Note  Patient: Jaime Powell  Procedure(s) Performed: XI ROBOTIC ASSISTED BILATERAL INGUINAL HERNIA (Bilateral: Groin) INSERTION OF MESH  Patient location during evaluation: PACU Anesthesia Type: General Level of consciousness: awake and alert Pain management: pain level controlled Vital Signs Assessment: post-procedure vital signs reviewed and stable Respiratory status: spontaneous breathing, nonlabored ventilation, respiratory function stable and patient connected to nasal cannula oxygen Cardiovascular status: blood pressure returned to baseline and stable Postop Assessment: no apparent nausea or vomiting Anesthetic complications: no   No notable events documented.   Last Vitals:  Vitals:   02/17/22 1011 02/17/22 1024  BP:  (!) 162/84  Pulse: (!) 50 (!) 52  Resp: (!) 8 18  Temp:  36.8 C  SpO2: 100% 100%    Last Pain:  Vitals:   02/18/22 1002  TempSrc:   PainSc: 8                  Martha Clan

## 2022-05-05 ENCOUNTER — Other Ambulatory Visit: Payer: Self-pay | Admitting: Internal Medicine

## 2022-05-05 DIAGNOSIS — C220 Liver cell carcinoma: Secondary | ICD-10-CM

## 2022-05-05 DIAGNOSIS — Z944 Liver transplant status: Secondary | ICD-10-CM

## 2022-05-09 ENCOUNTER — Other Ambulatory Visit: Payer: Self-pay | Admitting: Internal Medicine

## 2022-05-09 DIAGNOSIS — R937 Abnormal findings on diagnostic imaging of other parts of musculoskeletal system: Secondary | ICD-10-CM

## 2022-05-09 DIAGNOSIS — M5136 Other intervertebral disc degeneration, lumbar region: Secondary | ICD-10-CM

## 2022-05-09 DIAGNOSIS — Q7649 Other congenital malformations of spine, not associated with scoliosis: Secondary | ICD-10-CM

## 2022-05-09 DIAGNOSIS — M4647 Discitis, unspecified, lumbosacral region: Secondary | ICD-10-CM

## 2022-05-09 DIAGNOSIS — M51369 Other intervertebral disc degeneration, lumbar region without mention of lumbar back pain or lower extremity pain: Secondary | ICD-10-CM

## 2022-05-17 ENCOUNTER — Ambulatory Visit
Admission: RE | Admit: 2022-05-17 | Discharge: 2022-05-17 | Disposition: A | Discharge status: Home / Self Care | Payer: 59 | Source: Ambulatory Visit | Attending: Internal Medicine | Admitting: Internal Medicine

## 2022-05-17 DIAGNOSIS — M4647 Discitis, unspecified, lumbosacral region: Secondary | ICD-10-CM

## 2022-05-17 DIAGNOSIS — M5136 Other intervertebral disc degeneration, lumbar region: Secondary | ICD-10-CM

## 2022-05-17 DIAGNOSIS — R937 Abnormal findings on diagnostic imaging of other parts of musculoskeletal system: Secondary | ICD-10-CM | POA: Insufficient documentation

## 2022-05-17 DIAGNOSIS — C220 Liver cell carcinoma: Secondary | ICD-10-CM | POA: Diagnosis not present

## 2022-05-17 DIAGNOSIS — Q7649 Other congenital malformations of spine, not associated with scoliosis: Secondary | ICD-10-CM

## 2022-05-17 DIAGNOSIS — Z944 Liver transplant status: Secondary | ICD-10-CM | POA: Insufficient documentation

## 2022-05-17 MED ORDER — IOHEXOL 300 MG/ML  SOLN
100.0000 mL | Freq: Once | INTRAMUSCULAR | Status: AC | PRN
  Administered 2022-05-17: 100 mL via INTRAVENOUS

## 2022-05-17 MED ORDER — GADOBUTROL 1 MMOL/ML IV SOLN
6.0000 mL | Freq: Once | INTRAVENOUS | Status: AC | PRN
  Administered 2022-05-17: 6 mL via INTRAVENOUS

## 2022-10-19 ENCOUNTER — Other Ambulatory Visit: Payer: Self-pay | Admitting: Internal Medicine

## 2022-10-19 DIAGNOSIS — R935 Abnormal findings on diagnostic imaging of other abdominal regions, including retroperitoneum: Secondary | ICD-10-CM

## 2022-10-19 DIAGNOSIS — R1084 Generalized abdominal pain: Secondary | ICD-10-CM

## 2022-10-19 DIAGNOSIS — Z944 Liver transplant status: Secondary | ICD-10-CM

## 2022-10-19 DIAGNOSIS — R188 Other ascites: Secondary | ICD-10-CM

## 2022-10-19 DIAGNOSIS — I898 Other specified noninfective disorders of lymphatic vessels and lymph nodes: Secondary | ICD-10-CM

## 2022-10-19 DIAGNOSIS — K7460 Unspecified cirrhosis of liver: Secondary | ICD-10-CM

## 2022-10-19 DIAGNOSIS — S301XXA Contusion of abdominal wall, initial encounter: Secondary | ICD-10-CM

## 2022-11-23 ENCOUNTER — Ambulatory Visit
Admission: RE | Admit: 2022-11-23 | Discharge: 2022-11-23 | Disposition: A | Discharge status: Home / Self Care | Payer: 59 | Source: Ambulatory Visit | Attending: Internal Medicine | Admitting: Internal Medicine

## 2022-11-23 DIAGNOSIS — R188 Other ascites: Secondary | ICD-10-CM | POA: Insufficient documentation

## 2022-11-23 DIAGNOSIS — K7460 Unspecified cirrhosis of liver: Secondary | ICD-10-CM | POA: Diagnosis present

## 2022-11-23 DIAGNOSIS — R1084 Generalized abdominal pain: Secondary | ICD-10-CM | POA: Insufficient documentation

## 2022-11-23 DIAGNOSIS — R935 Abnormal findings on diagnostic imaging of other abdominal regions, including retroperitoneum: Secondary | ICD-10-CM | POA: Diagnosis present

## 2022-11-23 DIAGNOSIS — S301XXA Contusion of abdominal wall, initial encounter: Secondary | ICD-10-CM | POA: Insufficient documentation

## 2022-11-23 DIAGNOSIS — Z944 Liver transplant status: Secondary | ICD-10-CM | POA: Insufficient documentation

## 2022-11-23 DIAGNOSIS — I898 Other specified noninfective disorders of lymphatic vessels and lymph nodes: Secondary | ICD-10-CM | POA: Diagnosis present

## 2022-11-23 DIAGNOSIS — T8649 Other complications of liver transplant: Secondary | ICD-10-CM | POA: Insufficient documentation

## 2022-11-23 MED ORDER — IOHEXOL 300 MG/ML  SOLN
100.0000 mL | Freq: Once | INTRAMUSCULAR | Status: AC | PRN
  Administered 2022-11-23: 100 mL via INTRAVENOUS

## 2023-01-26 ENCOUNTER — Ambulatory Visit: Payer: 59 | Admitting: Urology

## 2023-03-07 ENCOUNTER — Ambulatory Visit: Payer: Self-pay | Admitting: Urology
